# Patient Record
Sex: Female | Born: 1980 | Marital: Married | Smoking: Never smoker
Health system: Southern US, Community
[De-identification: ages and names within clinical notes are randomized; demographics above are authoritative.]

## PROBLEM LIST (undated history)

## (undated) ENCOUNTER — Inpatient Hospital Stay (HOSPITAL_COMMUNITY): Payer: Self-pay

## (undated) DIAGNOSIS — C801 Malignant (primary) neoplasm, unspecified: Secondary | ICD-10-CM

## (undated) HISTORY — DX: Malignant (primary) neoplasm, unspecified: C80.1

---

## 2009-08-27 HISTORY — PX: BREAST SURGERY: SHX581

## 2009-11-27 DIAGNOSIS — C801 Malignant (primary) neoplasm, unspecified: Secondary | ICD-10-CM

## 2009-11-27 HISTORY — DX: Malignant (primary) neoplasm, unspecified: C80.1

## 2016-09-13 ENCOUNTER — Ambulatory Visit: Payer: Self-pay

## 2016-09-13 ENCOUNTER — Encounter: Payer: Self-pay | Admitting: Family Medicine

## 2016-09-13 DIAGNOSIS — Z3A01 Less than 8 weeks gestation of pregnancy: Secondary | ICD-10-CM

## 2016-09-13 LAB — POCT PREGNANCY, URINE: Preg Test, Ur: POSITIVE — AB

## 2016-09-13 NOTE — Progress Notes (Signed)
Patient presented to office today for a pregnancy test. Test confirms she is pregnant around 6 weeks base on last menstrual cycle. Patient has been given a letter of verification to apply for pregnancy medicaid. Medications have been reviewed at this time.She was advised to start taking prenatal care.

## 2016-10-16 ENCOUNTER — Ambulatory Visit (INDEPENDENT_AMBULATORY_CARE_PROVIDER_SITE_OTHER): Payer: Self-pay | Admitting: Student

## 2016-10-16 ENCOUNTER — Encounter: Payer: Self-pay | Admitting: Student

## 2016-10-16 ENCOUNTER — Ambulatory Visit: Payer: Self-pay

## 2016-10-16 VITALS — BP 100/66 | HR 72 | Ht 64.17 in | Wt 166.6 lb

## 2016-10-16 DIAGNOSIS — Z1151 Encounter for screening for human papillomavirus (HPV): Secondary | ICD-10-CM

## 2016-10-16 DIAGNOSIS — Z124 Encounter for screening for malignant neoplasm of cervix: Secondary | ICD-10-CM

## 2016-10-16 DIAGNOSIS — Z789 Other specified health status: Secondary | ICD-10-CM

## 2016-10-16 DIAGNOSIS — O09899 Supervision of other high risk pregnancies, unspecified trimester: Secondary | ICD-10-CM | POA: Insufficient documentation

## 2016-10-16 DIAGNOSIS — O34219 Maternal care for unspecified type scar from previous cesarean delivery: Secondary | ICD-10-CM

## 2016-10-16 DIAGNOSIS — Z853 Personal history of malignant neoplasm of breast: Secondary | ICD-10-CM | POA: Insufficient documentation

## 2016-10-16 DIAGNOSIS — Z98891 History of uterine scar from previous surgery: Secondary | ICD-10-CM | POA: Insufficient documentation

## 2016-10-16 DIAGNOSIS — O09521 Supervision of elderly multigravida, first trimester: Secondary | ICD-10-CM | POA: Insufficient documentation

## 2016-10-16 DIAGNOSIS — Z113 Encounter for screening for infections with a predominantly sexual mode of transmission: Secondary | ICD-10-CM

## 2016-10-16 DIAGNOSIS — Z23 Encounter for immunization: Secondary | ICD-10-CM

## 2016-10-16 DIAGNOSIS — O3680X0 Pregnancy with inconclusive fetal viability, not applicable or unspecified: Secondary | ICD-10-CM

## 2016-10-16 DIAGNOSIS — Z3401 Encounter for supervision of normal first pregnancy, first trimester: Secondary | ICD-10-CM

## 2016-10-16 LAB — POCT URINALYSIS DIP (DEVICE)
BILIRUBIN URINE: NEGATIVE
GLUCOSE, UA: NEGATIVE mg/dL
Ketones, ur: NEGATIVE mg/dL
LEUKOCYTES UA: NEGATIVE
NITRITE: NEGATIVE
Protein, ur: NEGATIVE mg/dL
Specific Gravity, Urine: 1.015 (ref 1.005–1.030)
UROBILINOGEN UA: 0.2 mg/dL (ref 0.0–1.0)
pH: 6.5 (ref 5.0–8.0)

## 2016-10-16 NOTE — Addendum Note (Signed)
Addended by: Geralyn Flash L on: 10/16/2016 10:14 AM   Modules accepted: Orders

## 2016-10-16 NOTE — Progress Notes (Signed)
Bedside US for viability = Single IUP;  FHR - 168 bpm per M-mode;  CRL - 3.09cm (10w 0d).  [name], CNM informed of results

## 2016-10-16 NOTE — Patient Instructions (Signed)
First Trimester of Pregnancy The first trimester of pregnancy is from week 1 until the end of week 12 (months 1 through 3). During this time, your baby will begin to develop inside you. At 6-8 weeks, the eyes and face are formed, and the heartbeat can be seen on ultrasound. At the end of 12 weeks, all the baby's organs are formed. Prenatal care is all the medical care you receive before the birth of your baby. Make sure you get good prenatal care and follow all of your doctor's instructions. Follow these instructions at home: Medicines  Take medicine only as told by your doctor. Some medicines are safe and some are not during pregnancy.  Take your prenatal vitamins as told by your doctor.  Take medicine that helps you poop (stool softener) as needed if your doctor says it is okay. Diet  Eat regular, healthy meals.  Your doctor will tell you the amount of weight gain that is right for you.  Avoid raw meat and uncooked cheese.  If you feel sick to your stomach (nauseous) or throw up (vomit):  Eat 4 or 5 small meals a day instead of 3 large meals.  Try eating a few soda crackers.  Drink liquids between meals instead of during meals.  If you have a hard time pooping (constipation):  Eat high-fiber foods like fresh vegetables, fruit, and whole grains.  Drink enough fluids to keep your pee (urine) clear or pale yellow. Activity and Exercise  Exercise only as told by your doctor. Stop exercising if you have cramps or pain in your lower belly (abdomen) or low back.  Try to avoid standing for long periods of time. Move your legs often if you must stand in one place for a long time.  Avoid heavy lifting.  Wear low-heeled shoes. Sit and stand up straight.  You can have sex unless your doctor tells you not to. Relief of Pain or Discomfort  Wear a good support bra if your breasts are sore.  Take warm water baths (sitz baths) to soothe pain or discomfort caused by hemorrhoids. Use  hemorrhoid cream if your doctor says it is okay.  Rest with your legs raised if you have leg cramps or low back pain.  Wear support hose if you have puffy, bulging veins (varicose veins) in your legs. Raise (elevate) your feet for 15 minutes, 3-4 times a day. Limit salt in your diet. Prenatal Care  Schedule your prenatal visits by the twelfth week of pregnancy.  Write down your questions. Take them to your prenatal visits.  Keep all your prenatal visits as told by your doctor. Safety  Wear your seat belt at all times when driving.  Make a list of emergency phone numbers. The list should include numbers for family, friends, the hospital, and police and fire departments. General Tips  Ask your doctor for a referral to a local prenatal class. Begin classes no later than at the start of month 6 of your pregnancy.  Ask for help if you need counseling or help with nutrition. Your doctor can give you advice or tell you where to go for help.  Do not use hot tubs, steam rooms, or saunas.  Do not douche or use tampons or scented sanitary pads.  Do not cross your legs for long periods of time.  Avoid litter boxes and soil used by cats.  Avoid all smoking, herbs, and alcohol. Avoid drugs not approved by your doctor.  Do not use any tobacco products,  including cigarettes, chewing tobacco, and electronic cigarettes. If you need help quitting, ask your doctor. You may get counseling or other support to help you quit.  Visit your dentist. At home, brush your teeth with a soft toothbrush. Be gentle when you floss. Get help if:  You are dizzy.  You have mild cramps or pressure in your lower belly.  You have a nagging pain in your belly area.  You continue to feel sick to your stomach, throw up, or have watery poop (diarrhea).  You have a bad smelling fluid coming from your vagina.  You have pain with peeing (urination).  You have increased puffiness (swelling) in your face, hands,  legs, or ankles. Get help right away if:  You have a fever.  You are leaking fluid from your vagina.  You have spotting or bleeding from your vagina.  You have very bad belly cramping or pain.  You gain or lose weight rapidly.  You throw up blood. It may look like coffee grounds.  You are around people who have [place] measles, fifth disease, or chickenpox.  You have a very bad headache.  You have shortness of breath.  You have any kind of trauma, such as from a fall or a car accident. This information is not intended to replace advice given to you by your health care provider. Make sure you discuss any questions you have with your health care provider. Document Released: 05/01/2008 Document Revised: 04/20/2016 Document Reviewed: 09/23/2013 Elsevier Interactive Patient Education  2017 [place] Medications in Pregnancy   Acne: Benzoyl Peroxide Salicylic Acid  Backache/Headache: Tylenol: 2 regular strength every 4 hours OR              2 Extra strength every 6 hours  Colds/Coughs/Allergies: Benadryl (alcohol free) 25 mg every 6 hours as needed Breath right strips Claritin Cepacol throat lozenges Chloraseptic throat spray Cold-Eeze- up to three times per day Cough drops, alcohol free Flonase (by prescription only) Guaifenesin Mucinex Robitussin DM (plain only, alcohol free) Saline nasal spray/drops Sudafed (pseudoephedrine) & Actifed ** use only after [redacted] weeks gestation and if you do not have high blood pressure Tylenol Vicks Vaporub Zinc lozenges Zyrtec   Constipation: Colace Ducolax suppositories Fleet enema Glycerin suppositories Metamucil Milk of magnesia Miralax Senokot Smooth move tea  Diarrhea: Kaopectate Imodium A-D  *NO pepto Bismol  Hemorrhoids: Anusol Anusol HC Preparation H Tucks  Indigestion: Tums Maalox Mylanta Zantac  Pepcid  Insomnia: Benadryl (alcohol free) 25mg  every 6 hours as needed Tylenol  PM Unisom, no Gelcaps  Leg Cramps: Tums MagGel  Nausea/Vomiting:  Bonine Dramamine Emetrol Ginger extract Sea bands Meclizine  Nausea medication to take during pregnancy:  Unisom (doxylamine succinate 25 mg tablets) Take one tablet daily at bedtime. If symptoms are not adequately controlled, the dose can be increased to a maximum recommended dose of two tablets daily (1/2 tablet in the morning, 1/2 tablet mid-afternoon and one at bedtime). Vitamin B6 100mg  tablets. Take one tablet twice a day (up to 200 mg per day).  Skin Rashes: Aveeno products Benadryl cream or 25mg  every 6 hours as needed Calamine Lotion 1% cortisone cream  Yeast infection: Gyne-lotrimin 7 Monistat 7  Gum/tooth pain: Anbesol  **If taking multiple medications, please check labels to avoid duplicating the same active ingredients **take medication as directed on the label ** Do not exceed 4000 mg of tylenol in 24 hours **Do not take medications that contain aspirin or ibuprofen  Constipation, Adult Constipation is when a person:  Poops (has a bowel movement) fewer times in a week than normal.  Has a hard time pooping.  Has poop that is dry, hard, or bigger than normal. Follow these instructions at home: Eating and drinking  Eat foods that have a lot of fiber, such as:  Fresh fruits and vegetables.  Whole grains.  Beans.  Eat less of foods that are high in fat, low in fiber, or overly processed, such as:  [place] fries.  Hamburgers.  Cookies.  Candy.  Soda.  Drink enough fluid to keep your pee (urine) clear or pale yellow. General instructions  Exercise regularly or as told by your doctor.  Go to the restroom when you feel like you need to poop. Do not hold it in.  Take over-the-counter and prescription medicines only as told by your doctor. These include any fiber supplements.  Do pelvic floor retraining exercises, such as:  Doing deep breathing while relaxing your  lower belly (abdomen).  Relaxing your pelvic floor while pooping.  Watch your condition for any changes.  Keep all follow-up visits as told by your doctor. This is important. Contact a doctor if:  You have pain that gets worse.  You have a fever.  You have not pooped for 4 days.  You throw up (vomit).  You are not hungry.  You lose weight.  You are bleeding from the anus.  You have thin, pencil-like poop (stool). Get help right away if:  You have a fever, and your symptoms suddenly get worse.  You leak poop or have blood in your poop.  Your belly feels hard or bigger than normal (is bloated).  You have very bad belly pain.  You feel dizzy or you faint. This information is not intended to replace advice given to you by your health care provider. Make sure you discuss any questions you have with your health care provider. Document Released: 05/01/2008 Document Revised: 06/02/2016 Document Reviewed: 05/03/2016 Elsevier Interactive Patient Education  2017 [employer].

## 2016-10-16 NOTE — Progress Notes (Signed)
  Subjective:    Priscilla Lewis is being seen today for her first obstetrical visit.  This is a planned pregnancy. She is at [redacted]w[redacted]d gestation. Her obstetrical history is significant for advanced maternal age and hx of c/section. Relationship with FOB: spouse, living together. Patient does intend to breast feed. Pregnancy history fully reviewed.  Patient reports vaginal discharge & constipation.  Review of Systems:   Review of Systems Review of Systems - History obtained from the patient General ROS: negative for - chills or fever Gastrointestinal ROS: positive for - constipation negative for - abdominal pain, diarrhea, melena or nausea/vomiting Genito-Urinary ROS: positive for - vaginal discharge (thin white, no odor) negative for - vaginal bleeding or vaginal irritation  Objective:     BP 100/66   Pulse 72   Ht 5' 4.17" (1.63 m)   Wt 166 lb 9.6 oz (75.6 kg)   LMP 08/07/2016   BMI 28.45 kg/m  Physical Exam  Exam   Physical Examination: General appearance - alert, well appearing, and in no distress and oriented to person, place, and time Mouth - dental hygiene good Neck - supple, no significant adenopathy Lymphatics - no palpable lymphadenopathy Chest - clear to auscultation, no wheezes, rales or rhonchi, symmetric air entry Heart - normal rate, regular rhythm, normal S1, S2, no murmurs, rubs, clicks or gallops Abdomen - soft, nontender, nondistended, no masses or organomegaly Breasts - right breast normal without mass, skin or nipple changes or axillary nodes, post-mastectomy site well healed and free of suspicious changes (left breast) Pelvic - normal external genitalia, vulva, vagina, cervix, uterus and adnexa, PAP: Pap smear done today Skin - normal coloration and turgor, no rashes, no suspicious skin lesions noted  Assessment:    Pregnancy: [id number] Patient Active Problem List   Diagnosis Date Noted  . AMA (advanced maternal age) multigravida 64+, first trimester  10/16/2016  . Supervision of normal pregnancy in first trimester 10/16/2016  . History of cancer of left breast 10/16/2016  . Language barrier affecting health care 10/16/2016  . History of C-section 10/16/2016      Informal ultrasound performed by Diane Day for viability -- [facility] @ [redacted]w[redacted]d with cardiac activity Plan:  1. AMA (advanced maternal age) multigravida 41+, first trimester -Patient declined 1st trimester screen & genetic counseling   2. Encounter to determine fetal viability of pregnancy, single or unspecified fetus  - US OB Limited  3. Encounter for supervision of normal first pregnancy in first trimester - Cytology - PAP - Prenatal Profile - Pain Mgmt, Profile 6 Conf w/o mM, U - Hemoglobinopathy Evaluation  4. Language barrier affecting health care -Arabic interpreter at bedside    Initial labs drawn. Prenatal vitamins- currently taking. Problem list reviewed and updated. Genetic counseling: declined. Role of ultrasound in pregnancy discussed; fetal survey: will order anatomy scan at next visit. Follow up in 4 weeks.    Priscilla Lewis 10/16/2016

## 2016-10-17 LAB — PAIN MGMT, PROFILE 6 CONF W/O MM, U
6 ACETYLMORPHINE: NEGATIVE ng/mL (ref <10)
ALCOHOL METABOLITES: NEGATIVE ng/mL (ref <500)
AMPHETAMINES: NEGATIVE ng/mL (ref <500)
Barbiturates: NEGATIVE ng/mL (ref <300)
Benzodiazepines: NEGATIVE ng/mL (ref <100)
COCAINE METABOLITE: NEGATIVE ng/mL (ref <150)
Creatinine: 96.4 mg/dL (ref >=20.0)
MARIJUANA METABOLITE: NEGATIVE ng/mL (ref <20)
Methadone Metabolite: NEGATIVE ng/mL (ref <100)
OPIATES: NEGATIVE ng/mL (ref <100)
OXIDANT: NEGATIVE ug/mL (ref <200)
OXYCODONE: NEGATIVE ng/mL (ref <100)
PLEASE NOTE: 0
Phencyclidine: NEGATIVE ng/mL (ref <25)
pH: 6.86 (ref 4.5–9.0)

## 2016-10-17 LAB — PRENATAL PROFILE (SOLSTAS)
Antibody Screen: NEGATIVE
BASOS PCT: 0 %
Basophils Absolute: 0 cells/uL (ref 0–200)
EOS PCT: 2 %
Eosinophils Absolute: 102 cells/uL (ref 15–500)
HEMATOCRIT: 41 % (ref 35.0–45.0)
HEMOGLOBIN: 13.3 g/dL (ref 11.7–15.5)
HIV: NONREACTIVE
Hepatitis B Surface Ag: NEGATIVE
LYMPHS ABS: 1479 {cells}/uL (ref 850–3900)
LYMPHS PCT: 29 %
MCH: 29 pg (ref 27.0–33.0)
MCHC: 32.4 g/dL (ref 32.0–36.0)
MCV: 89.5 fL (ref 80.0–100.0)
MONO ABS: 255 {cells}/uL (ref 200–950)
MPV: 10.4 fL (ref 7.5–12.5)
Monocytes Relative: 5 %
NEUTROS PCT: 64 %
Neutro Abs: 3264 cells/uL (ref 1500–7800)
Platelets: 196 10*3/uL (ref 140–400)
RBC: 4.58 MIL/uL (ref 3.80–5.10)
RDW: 14 % (ref 11.0–15.0)
RH TYPE: POSITIVE
Rubella: 14.3 Index — ABNORMAL HIGH (ref <0.90)
WBC: 5.1 10*3/uL (ref 3.8–10.8)

## 2016-10-17 LAB — GC/CHLAMYDIA PROBE AMP (~~LOC~~) NOT AT ARMC
CHLAMYDIA, DNA PROBE: NEGATIVE
NEISSERIA GONORRHEA: NEGATIVE

## 2016-10-18 LAB — CYTOLOGY - PAP
DIAGNOSIS: NEGATIVE
HPV (WINDOPATH): NOT DETECTED

## 2016-10-20 LAB — HEMOGLOBINOPATHY EVALUATION
HCT: 41 % (ref 35.0–45.0)
HGB A2 QUANT: 2.7 % (ref 1.8–3.5)
HGB A: 96.3 % (ref >96.0)
Hemoglobin: 13.3 g/dL (ref 11.7–15.5)
MCH: 29 pg (ref 27.0–33.0)
MCV: 89.5 fL (ref 80.0–100.0)
RBC: 4.58 MIL/uL (ref 3.80–5.10)
RDW: 14 % (ref 11.0–15.0)

## 2016-11-14 ENCOUNTER — Ambulatory Visit (INDEPENDENT_AMBULATORY_CARE_PROVIDER_SITE_OTHER): Payer: Self-pay | Admitting: Family Medicine

## 2016-11-14 VITALS — BP 111/73 | HR 84 | Wt 175.5 lb

## 2016-11-14 DIAGNOSIS — O26892 Other specified pregnancy related conditions, second trimester: Secondary | ICD-10-CM

## 2016-11-14 DIAGNOSIS — O09521 Supervision of elderly multigravida, first trimester: Secondary | ICD-10-CM

## 2016-11-14 DIAGNOSIS — Z98891 History of uterine scar from previous surgery: Secondary | ICD-10-CM

## 2016-11-14 DIAGNOSIS — Z3402 Encounter for supervision of normal first pregnancy, second trimester: Secondary | ICD-10-CM

## 2016-11-14 DIAGNOSIS — N898 Other specified noninflammatory disorders of vagina: Secondary | ICD-10-CM

## 2016-11-14 DIAGNOSIS — O09522 Supervision of elderly multigravida, second trimester: Secondary | ICD-10-CM

## 2016-11-14 DIAGNOSIS — O99612 Diseases of the digestive system complicating pregnancy, second trimester: Secondary | ICD-10-CM

## 2016-11-14 DIAGNOSIS — Z3689 Encounter for other specified antenatal screening: Secondary | ICD-10-CM

## 2016-11-14 DIAGNOSIS — K5900 Constipation, unspecified: Secondary | ICD-10-CM

## 2016-11-14 LAB — HEMOGLOBIN A1C
Hgb A1c MFr Bld: 4.6 % (ref <5.7)
Mean Plasma Glucose: 85 mg/dL

## 2016-11-14 MED ORDER — DOCUSATE SODIUM 100 MG PO CAPS: 100.0000 mg | ORAL_CAPSULE | Freq: Two times a day (BID) | ORAL | 1 refills | Status: DC

## 2016-11-14 NOTE — Patient Instructions (Addendum)
High-Fiber Diet Fiber, also called dietary fiber, is a type of carbohydrate found in fruits, vegetables, whole grains, and beans. A high-fiber diet can have many health benefits. Your health care provider may recommend a high-fiber diet to help:  Prevent constipation. Fiber can make your bowel movements more regular.  Lower your cholesterol.  Relieve hemorrhoids, uncomplicated diverticulosis, or irritable bowel syndrome.  Prevent overeating as part of a weight-loss plan.  Prevent heart disease, type 2 diabetes, and certain cancers. What is my plan? The recommended daily intake of fiber includes:  38 grams for men under age 66.  42 grams for men over age 84.  36 grams for women under age 46.  15 grams for women over age 15. You can get the recommended daily intake of dietary fiber by eating a variety of fruits, vegetables, grains, and beans. Your health care provider may also recommend a fiber supplement if it is not possible to get enough fiber through your diet. What do I need to know about a high-fiber diet?  Fiber supplements have not been widely studied for their effectiveness, so it is better to get fiber through food sources.  Always check the fiber content on thenutrition facts label of any prepackaged food. Look for foods that contain at least 5 grams of fiber per serving.  Ask your dietitian if you have questions about specific foods that are related to your condition, especially if those foods are not listed in the following section.  Increase your daily fiber consumption gradually. Increasing your intake of dietary fiber too quickly may cause bloating, cramping, or gas.  Drink plenty of water. Water helps you to digest fiber. What foods can I eat? Grains  Whole-grain breads. Multigrain cereal. Oats and oatmeal. Brown rice. Barley. Bulgur wheat. [place]. Bran muffins. Popcorn. Rye wafer crackers. Vegetables  Sweet potatoes. Spinach. Kale. Artichokes. Cabbage. Broccoli.  Green peas. Carrots. Squash. Fruits  Berries. Pears. Apples. Oranges. Avocados. Prunes and raisins. Dried figs. Meats and Other Protein Sources  [place], kidney, pinto, and soy beans. Split peas. Lentils. Nuts and seeds. Dairy  Fiber-fortified yogurt. Beverages  Fiber-fortified soy milk. Fiber-fortified orange juice. Other  Fiber bars. The items listed above may not be a complete list of recommended foods or beverages. Contact your dietitian for more options.  What foods are not recommended? Grains  White bread. Pasta made with refined flour. White rice. Vegetables  Fried potatoes. Canned vegetables. Well-cooked vegetables. Fruits  Fruit juice. Cooked, strained fruit. Meats and Other Protein Sources  Fatty cuts of meat. Fried Sales executive or fried fish. Dairy  Milk. Yogurt. Cream cheese. Sour cream. Beverages  Soft drinks. Other  Cakes and pastries. Butter and oils. The items listed above may not be a complete list of foods and beverages to avoid. Contact your dietitian for more information.  What are some tips for including high-fiber foods in my diet?  Eat a wide variety of high-fiber foods.  Make sure that half of all grains consumed each day are whole grains.  Replace breads and cereals made from refined flour or white flour with whole-grain breads and cereals.  Replace white rice with brown rice, bulgur wheat, or millet.  Start the day with a breakfast that is high in fiber, such as a cereal that contains at least 5 grams of fiber per serving.  Use beans in place of meat in soups, salads, or pasta.  Eat high-fiber snacks, such as berries, raw vegetables, nuts, or popcorn. This information is not intended to replace  advice given to you by your health care provider. Make sure you discuss any questions you have with your health care provider. Document Released: 11/13/2005 Document Revised: 04/20/2016 Document Reviewed: 04/28/2014 Elsevier Interactive Patient Education  2017  [place] After Cesarean Delivery Information A trial of labor after cesarean delivery (TOLAC) is when a woman tries to give birth vaginally after a previous cesarean delivery. TOLAC may be a safe and appropriate option for you depending on your medical history and other risk factors. When TOLAC is successful and you are able to have a vaginal delivery, this is called a vaginal birth after cesarean delivery (VBAC).  CANDIDATES FOR TOLAC TOLAC is possible for some women who: Have undergone one or two prior cesarean deliveries in which the incision of the uterus was horizontal (low transverse). Are carrying twins and have had one prior low transverse incision during a cesarean delivery. Do not have a vertical (classical) uterine scar. Have not had a tear in the wall of their uterus (uterine rupture). TOLAC is also supported for women who meet appropriate criteria and: Are under the age of 13 years. Are tall and have a body mass index (BMI) of less than 30. Have an unknown uterine scar. Give birth in a facility equipped to handle an emergency cesarean delivery. This team should be able to handle possible complications such as a uterine rupture. Have thorough counseling about the benefits and risks of TOLAC. Have discussed future pregnancy plans with their health care provider. Plan to have several more pregnancies. MOST SUCCESSFUL CANDIDATES FOR TOLAC: Have had a successful vaginal delivery before or after their cesarean delivery. Experience labor that begins naturally on or before the due date (40 weeks of gestation). Do not have a very large (macrosomic) baby.  Had a prior cesarean delivery but are not currently experiencing factors that would prompt a cesarean delivery (such as a breech position). Had only one prior cesarean delivery. Had a prior cesarean delivery that was performed early in labor and not after full cervical dilation. TOLAC may be most appropriate for  women who meet the above guidelines and who plan to have more pregnancies. TOLAC is not recommended for home births. LEAST SUCCESSFUL CANDIDATES FOR TOLAC: Have an induced labor with an unfavorable cervix. An unfavorable cervix is when the cervix is not dilating enough (among other factors). Have never had a vaginal delivery. Have had more than two cesarean deliveries. Have a pregnancy at more than 40 weeks of gestation. Are pregnant with a baby with a suspected weight greater than 4,000 grams (8 pounds) and who have no prior history of a vaginal delivery. Have closely spaced pregnancies. SUGGESTED BENEFITS OF TOLAC You may have a faster recovery time. You may have a shorter stay in the hospital. You may have less pain and fewer problems than with a cesarean delivery. Women who have a cesarean delivery have a higher chance of needing blood or getting a fever, an infection, or a blood clot in the legs. SUGGESTED RISKS OF TOLAC The highest risk of complications happens to women who attempt a TOLAC and fail. A failed TOLAC results in an unplanned cesarean delivery. Risks related to [facility] or repeat cesarean deliveries include:  Blood loss. Infection. Blood clot. Injury to surrounding tissues or organs. Having to remove the uterus (hysterectomy). Potential problems with the placenta (such as placenta previa or placenta accreta) in future pregnancies. Although very rare, the main concerns with TOLAC are: Rupture of the uterine scar  from a past cesarean delivery. Needing an emergency cesarean delivery. Having a bad outcome for the baby (perinatal morbidity). FOR MORE INFORMATION American [employer] of Obstetricians [employer] Gynecologists: www.acog.[place]: www.midwife.org This information is not intended to replace advice given to you by your health care provider. Make sure you discuss any questions you have with your health care provider. Document Released: 08/01/2011  Document Revised: 09/03/2013 Document Reviewed: 05/05/2013 Elsevier Interactive Patient Education  2017 [employer].

## 2016-11-14 NOTE — Progress Notes (Signed)
Video Interpreter # [id number] Pt c/o white discharge with itchiness  Anatomy US scheduled for January 23rd @ 0845.  Pt notified.  VBAC Consent signed

## 2016-11-14 NOTE — Progress Notes (Signed)
Subjective:  Priscilla Lewis is a 35 y.o. [id number] at [redacted]w[redacted]d being seen today for ongoing prenatal care.  She is currently monitored for the following issues for this high-risk pregnancy and has AMA (advanced maternal age) multigravida 20+, first trimester; Supervision of normal pregnancy in first trimester; History of cancer of left breast; Language barrier affecting health care; and History of C-section on her problem list.  Patient reports vaginal irritation and white vaginal discharge, itching. She also reports weight gain. Per records she has gained 9 lbs since last visit already. She is not exercising and is eating more..  Contractions: Not present. Vag. Bleeding: None.   . Denies leaking of fluid.   The following portions of the patient's history were reviewed and updated as appropriate: allergies, current medications, past family history, past medical history, past social history, past surgical history and problem list. Problem list updated.  Objective:   Vitals:   11/14/16 0833  BP: 111/73  Pulse: 84  Weight: 175 lb 8 oz (79.6 kg)    Fetal Status: Fetal Heart Rate (bpm): 153         General:  Alert, oriented and cooperative. Patient is in no acute distress.  Skin: Skin is warm and dry. No rash noted.   Cardiovascular: Normal heart rate noted  Respiratory: Normal respiratory effort, no problems with respiration noted  Abdomen: Soft, gravid, appropriate for gestational age. Pain/Pressure: Absent     Pelvic:  Cervical exam deferred        Extremities: Normal range of motion.  Edema: None  Mental Status: Normal mood and affect. Normal behavior. Normal judgment and thought content.   Urinalysis:      Assessment and Plan:  Pregnancy: [id number] at [redacted]w[redacted]d  1. Supervision of low-risk first pregnancy, second trimester - 9 lb weight gain in 4 weeks, screening for pre-gestational diabetes today. Discussed diet and exercise. May benefit from dietary consult next visit. Patient is  fasting today (aside from a lemon in water this AM) - [place] MFM OB COMP + 14 WK; Future - Glucose tolerance, 2 hours - HgB A1c  2. Vaginal discharge during pregnancy in second trimester - No overt appearing yeast on pelvic exam - Wet prep, genital  3. Encounter for fetal anatomic survey - [place] MFM OB COMP + 14 WK; Future  4. AMA (advanced maternal age) multigravida 84+, first trimester - Patient had declined first trim and genetic counseling at previous visit.  5. History of C-section - 10 years ago was cesarean, only had 1 cesarean and the other two were vaginal (first two deliveries). - Cesarean was due to NRFHT (nuchal cords) - Discussed risks associated with TOLAC, she would like to proceed. (Consented today) - Desires tubal ligation whether cesarean performed or postpartum BTL.  6. Constipation during pregnancy in second trimester - Colace Rx - Discussed diet and hydration   Preterm labor symptoms and general obstetric precautions including but not limited to vaginal bleeding, contractions, leaking of fluid and fetal movement were reviewed in detail with the patient. Please refer to After Visit Summary for other counseling recommendations.  Return in about 4 weeks (around 12/12/2016) for Routine OB visit, may need dietician consult.   Trial of Labor after Cesarean Consent  35 y.o. [id number] at [redacted]w[redacted]d with Estimated Date of Delivery: 05/14/17 was seen today in office to discuss trial of labor after cesarean section (TOLAC) versus elective repeat cesarean delivery (ERCD). The following risks were discussed with the patient.  Risk of uterine rupture at  term is 0.78 percent with TOLAC and 0.22 percent with ERCD. 1 in 10 uterine ruptures will result in neonatal death or neurological injury. The benefits of a trial of labor after cesarean (TOLAC) resulting in a vaginal birth after cesarean (VBAC) include the following: shorter length of hospital stay and postpartum recovery (in most cases);  fewer complications, such as postpartum fever, wound or uterine infection, thromboembolism (blood clots in the leg or lung), need for blood transfusion and fewer neonatal breathing problems. The risks of an attempted VBAC or TOLAC include the following: Risk of failed trial of labor after cesarean (TOLAC) without a vaginal birth after cesarean (VBAC) resulting in repeat cesarean delivery (RCD) in about 20 to 30 percent of women who attempt VBAC.  Risk of rupture of uterus resulting in an emergency cesarean delivery. The risk of uterine rupture may be related in part to the type of uterine incision made during the first cesarean delivery. A previous transverse uterine incision has the lowest risk of rupture (0.2 to 1.5 percent risk). Vertical or T-shaped uterine incisions have a higher risk of uterine rupture (4 to 9 percent risk)The risk of fetal death is very low with both VBAC and elective repeat cesarean delivery (ERCD), but the likelihood of fetal death is higher with VBAC than with ERCD. Maternal death is very rare with either type of delivery. The risks of an elective repeat cesarean delivery (ERCD) were reviewed with the patient including but not limited to: 12/998 risk of uterine rupture which could have serious consequences, bleeding which may require transfusion; infection which may require antibiotics; injury to bowel, bladder or other surrounding organs (bowel, bladder, ureters); injury to the fetus; need for additional procedures including hysterectomy in the event of a life-threatening hemorrhage; thromboembolic phenomenon; abnormal placentation; incisional problems; death and other postoperative or anesthesia complications.    These risks and benefits are summarized on the consent form, which was reviewed with the patient during the visit.  All her questions answered and she signed a consent indicating a preference for TOLAC/ERCD. A copy of the consent was given to the patient.  (She does say  that she would want tubal sterilization in the event a cesarean section is performed due to any maternal-fetal indication.)    Isaias Sakai, DO OB Fellow Center for [facility], [facility]

## 2016-11-15 LAB — WET PREP, GENITAL
TRICH WET PREP: NONE SEEN
Yeast Wet Prep HPF POC: NONE SEEN

## 2016-11-15 LAB — 2HR GTT W 1 HR, CARPENTER, 75 G
GLUCOSE, FASTING, GEST: 71 mg/dL (ref 65–91)
Glucose, 1 Hr, Gest: 100 mg/dL (ref <180)
Glucose, 2 Hr, Gest: 68 mg/dL (ref <153)

## 2016-11-27 NOTE — L&D Delivery Note (Addendum)
Indication for operative vaginal delivery: Repetitive prolonged decels  Risks of vacuum assistance were discussed in detail, including but not limited to, bleeding, infection, damage to maternal tissues, fetal cephalohematoma, inability to effect vaginal delivery of the head or shoulder dystocia that cannot be resolved by established maneuvers and need for emergency cesarean section.  Patient gave verbal consent.  Patient was examined and found to be fully dilated with fetal station of +2. The soft Kiwi vacuum soft cup was positioned over the sagittal suture 3 cm anterior to posterior fontanelle.  Pressure was then increased to 500 mmHg, and the patient was instructed to push.  Pulling was administered along the pelvic curve.  A single pull was administered during 1 push, NO popoffs.  The infant was then delivered atraumatically    36 y.o. [id number] at [redacted]w[redacted]d delivered via vacuum assistance a viable female infant in cephalic, OA position. Loose nuchal cord x1, easily reduced. Right posterior arm/hand delivered first due to compound hand, anterior shoulder delivered with ease. 60 sec delayed cord clamping. Cord clamped x2 and cut. Cord blood gas collected and sent. Placenta delivered spontaneously intact, with 3VC. Fundus firm on exam with massage and pitocin. Good hemostasis noted.  Anesthesia: Epidural Laceration: None Suture: N/A Good hemostasis noted. EBL: 150cc  Mom and baby recovering in LDR.    Apgars: APGAR (1 MIN): 8   APGAR (5 MINS): 9   Weight: Pending skin to skin  Cord gas pH: 7.250  Sponge and instrument count were correct x2. Placenta sent to L&D.  Dr. Kennon Rounds was present for entire vacuum delivery.  Katherine Basset, DO OB Fellow Center for [facility], [facility] 05/15/2017, 2:03 AM

## 2016-11-30 ENCOUNTER — Other Ambulatory Visit: Payer: Self-pay

## 2016-11-30 MED ORDER — METRONIDAZOLE 500 MG PO TABS: 500.0000 mg | ORAL_TABLET | Freq: Two times a day (BID) | ORAL | 0 refills | Status: DC

## 2016-11-30 NOTE — Telephone Encounter (Signed)
Patient has a UTI and Flagyl has been called into her pharmacy.

## 2016-12-04 ENCOUNTER — Telehealth: Payer: Self-pay | Admitting: *Deleted

## 2016-12-04 NOTE — Telephone Encounter (Signed)
Per results review + BV 11/14/16- message from Dr. Vanetta Shawl to notify patient. Per chart review Faythe Dingwall, CMA called patient 11/30/16 and left message for [place] to call our office.

## 2016-12-08 ENCOUNTER — Encounter (HOSPITAL_COMMUNITY): Payer: Self-pay | Admitting: Family Medicine

## 2016-12-12 NOTE — Telephone Encounter (Signed)
[place] confirmed that pt has not picked up Rx for Flagyl.  She has office appt on 1/17 - will be notified at that time.

## 2016-12-13 ENCOUNTER — Encounter: Payer: Self-pay | Admitting: Certified Nurse Midwife

## 2016-12-16 ENCOUNTER — Encounter (HOSPITAL_COMMUNITY): Payer: Self-pay

## 2016-12-19 ENCOUNTER — Other Ambulatory Visit: Payer: Self-pay | Admitting: Family Medicine

## 2016-12-19 ENCOUNTER — Ambulatory Visit (HOSPITAL_COMMUNITY)
Admission: RE | Admit: 2016-12-19 | Discharge: 2016-12-19 | Disposition: A | Discharge status: Home / Self Care | Payer: Medicaid Other | Source: Ambulatory Visit | Attending: Family Medicine | Admitting: Family Medicine

## 2016-12-19 ENCOUNTER — Ambulatory Visit (INDEPENDENT_AMBULATORY_CARE_PROVIDER_SITE_OTHER): Payer: Self-pay | Admitting: Medical

## 2016-12-19 DIAGNOSIS — O09522 Supervision of elderly multigravida, second trimester: Secondary | ICD-10-CM

## 2016-12-19 DIAGNOSIS — O34219 Maternal care for unspecified type scar from previous cesarean delivery: Secondary | ICD-10-CM

## 2016-12-19 DIAGNOSIS — Z3402 Encounter for supervision of normal first pregnancy, second trimester: Secondary | ICD-10-CM

## 2016-12-19 DIAGNOSIS — Z3689 Encounter for other specified antenatal screening: Secondary | ICD-10-CM | POA: Insufficient documentation

## 2016-12-19 DIAGNOSIS — O34211 Maternal care for low transverse scar from previous cesarean delivery: Secondary | ICD-10-CM | POA: Insufficient documentation

## 2016-12-19 DIAGNOSIS — Z3A19 19 weeks gestation of pregnancy: Secondary | ICD-10-CM

## 2016-12-19 DIAGNOSIS — Z3481 Encounter for supervision of other normal pregnancy, first trimester: Secondary | ICD-10-CM

## 2016-12-19 NOTE — Progress Notes (Signed)
   PRENATAL VISIT NOTE  Subjective:  Priscilla Lewis is a 36 y.o. [id number] at [redacted]w[redacted]d being seen today for ongoing prenatal care.  She is currently monitored for the following issues for this high-risk pregnancy and has AMA (advanced maternal age) multigravida 48+, first trimester; Supervision of normal pregnancy in first trimester; History of cancer of left breast; Language barrier affecting health care; and History of C-section on her problem list.  Patient reports headache.  Contractions: Not present. Vag. Bleeding: None.  Movement: Present. Denies leaking of fluid.   The following portions of the patient's history were reviewed and updated as appropriate: allergies, current medications, past family history, past medical history, past social history, past surgical history and problem list. Problem list updated.  Objective:   Vitals:   12/19/16 1002  BP: 110/62  Pulse: 84  Weight: 181 lb (82.1 kg)    Fetal Status: Fetal Heart Rate (bpm): 150   Movement: Present     General:  Alert, oriented and cooperative. Patient is in no acute distress.  Skin: Skin is warm and dry. No rash noted.   Cardiovascular: Normal heart rate noted  Respiratory: Normal respiratory effort, no problems with respiration noted  Abdomen: Soft, gravid, appropriate for gestational age. Pain/Pressure: Absent     Pelvic:  Cervical exam deferred        Extremities: Normal range of motion.  Edema: Trace  Mental Status: Normal mood and affect. Normal behavior. Normal judgment and thought content.   Assessment and Plan:  Pregnancy: [id number] at [redacted]w[redacted]d  1. Encounter for supervision of other normal pregnancy in first trimester - Reviewed anatomy US results - normal- female  - Discussed trying Tylenol PRN for headaches and increased PO hydration, if still frequent headaches at next visit will discuss other medications - Patient unsure about breast feeding due to history of breast cancer and unilateral mastectomy, advised to  consult with lactation, but likely will need to at least supplement with formula  Second trimester warning signs/symptoms and general obstetric precautions including but not limited to vaginal bleeding, contractions, leaking of fluid and fetal movement were reviewed in detail with the patient. Please refer to After Visit Summary for other counseling recommendations.  Return in about 4 weeks (around 01/16/2017) for LOB.   Luvenia Redden, PA-C

## 2016-12-19 NOTE — Patient Instructions (Addendum)
Second Trimester of Pregnancy The second trimester is from week 13 through week 28, month 4 through 6. This is often the time in pregnancy that you feel your best. Often times, morning sickness has lessened or quit. You may have more energy, and you may get hungry more often. Your unborn baby (fetus) is growing rapidly. At the end of the sixth month, he or she is about 9 inches long and weighs about 1 pounds. You will likely feel the baby move (quickening) between 18 and 20 weeks of pregnancy. Follow these instructions at home:  Avoid all smoking, herbs, and alcohol. Avoid drugs not approved by your doctor.  Do not use any tobacco products, including cigarettes, chewing tobacco, and electronic cigarettes. If you need help quitting, ask your doctor. You may get counseling or other support to help you quit.  Only take medicine as told by your doctor. Some medicines are safe and some are not during pregnancy.  Exercise only as told by your doctor. Stop exercising if you start having cramps.  Eat regular, healthy meals.  Wear a good support bra if your breasts are tender.  Do not use hot tubs, steam rooms, or saunas.  Wear your seat belt when driving.  Avoid raw meat, uncooked cheese, and liter boxes and soil used by cats.  Take your prenatal vitamins.  Take 1500-2000 milligrams of calcium daily starting at the 20th week of pregnancy until you deliver your baby.  Try taking medicine that helps you poop (stool softener) as needed, and if your doctor approves. Eat more fiber by eating fresh fruit, vegetables, and whole grains. Drink enough fluids to keep your pee (urine) clear or pale yellow.  Take warm water baths (sitz baths) to soothe pain or discomfort caused by hemorrhoids. Use hemorrhoid cream if your doctor approves.  If you have puffy, bulging veins (varicose veins), wear support hose. Raise (elevate) your feet for 15 minutes, 3-4 times a day. Limit salt in your diet.  Avoid heavy  lifting, wear low heals, and sit up straight.  Rest with your legs raised if you have leg cramps or low back pain.  Visit your dentist if you have not gone during your pregnancy. Use a soft toothbrush to brush your teeth. Be gentle when you floss.  You can have sex (intercourse) unless your doctor tells you not to.  Go to your doctor visits. Get help if:  You feel dizzy.  You have mild cramps or pressure in your lower belly (abdomen).  You have a nagging pain in your belly area.  You continue to feel sick to your stomach (nauseous), throw up (vomit), or have watery poop (diarrhea).  You have bad smelling fluid coming from your vagina.  You have pain with peeing (urination). Get help right away if:  You have a fever.  You are leaking fluid from your vagina.  You have spotting or bleeding from your vagina.  You have severe belly cramping or pain.  You lose or gain weight rapidly.  You have trouble catching your breath and have chest pain.  You notice sudden or extreme puffiness (swelling) of your face, hands, ankles, feet, or legs.  You have not felt the baby move in over an hour.  You have severe headaches that do not go away with medicine.  You have vision changes. This information is not intended to replace advice given to you by your health care provider. Make sure you discuss any questions you have with your health care   provider. Document Released: 02/07/2010 Document Revised: 04/20/2016 Document Reviewed: 01/14/2013 Elsevier Interactive Patient Education  2017 [place] Syndrome Introduction Carpal tunnel syndrome is a condition that causes pain in your hand and arm. The carpal tunnel is a narrow area that is on the palm side of your wrist. Repeated wrist motion or certain diseases may cause swelling in the tunnel. This swelling can pinch the main nerve in the wrist (median nerve). Follow these instructions at home: If you have a  splint:  Wear it as told by your doctor. Remove it only as told by your doctor.  Loosen the splint if your fingers:  Become numb and tingle.  Turn blue and cold.  Keep the splint clean and dry. General instructions  Take over-the-counter and prescription medicines only as told by your doctor.  Rest your wrist from any activity that may be causing your pain. If needed, talk to your employer about changes that can be made in your work, such as getting a wrist pad to use while typing.  If directed, apply ice to the painful area:  Put ice in a plastic bag.  Place a towel between your skin and the bag.  Leave the ice on for 20 minutes, 2-3 times per day.  Keep all follow-up visits as told by your doctor. This is important.  Do any exercises as told by your doctor, physical therapist, or occupational therapist. Contact a doctor if:  You have new symptoms.  Medicine does not help your pain.  Your symptoms get worse. This information is not intended to replace advice given to you by your health care provider. Make sure you discuss any questions you have with your health care provider. Document Released: 11/02/2011 Document Revised: 04/20/2016 Document Reviewed: 03/31/2015  2017 Elsevier

## 2016-12-19 NOTE — Progress Notes (Signed)
Arabic video interpreter "Mina" [phone] used for visit Pt c/o frequent headaches

## 2017-01-16 ENCOUNTER — Ambulatory Visit (INDEPENDENT_AMBULATORY_CARE_PROVIDER_SITE_OTHER): Payer: Self-pay | Admitting: Student

## 2017-01-16 VITALS — BP 99/45 | HR 90 | Wt 194.1 lb

## 2017-01-16 DIAGNOSIS — Z789 Other specified health status: Secondary | ICD-10-CM

## 2017-01-16 DIAGNOSIS — O09521 Supervision of elderly multigravida, first trimester: Secondary | ICD-10-CM

## 2017-01-16 DIAGNOSIS — Z3481 Encounter for supervision of other normal pregnancy, first trimester: Secondary | ICD-10-CM

## 2017-01-16 NOTE — Patient Instructions (Signed)
Gestational Diabetes Mellitus, Diagnosis Gestational diabetes (gestational diabetes mellitus) is a short-term (temporary) form of diabetes that can happen during pregnancy. It goes away after you give birth. It may be caused by one or both of these problems:  Your body does not make enough of a hormone called insulin.  Your body does not respond in a normal way to insulin that it makes. Insulin lets sugars (glucose) go into cells in the body. This gives you energy. If you have diabetes, sugars cannot get into cells. This causes high blood sugar (hyperglycemia). If diabetes is treated, it may not hurt you or your baby. Your doctor will set treatment goals for you. In general, you should have these blood sugar levels:  After not eating for a long time (fasting): 95 mg/dL (5.3 mmol/L).  After meals (postprandial):  One hour after a meal: at or below 140 mg/dL (7.8 mmol/L).  Two hours after a meal: at or below 120 mg/dL (6.7 mmol/L).  A1c (hemoglobin A1c) level: 6-6.5%. Follow these instructions at home: Questions to Ask Your Doctor  You may want to ask these questions:  Do I need to meet with a diabetes educator?  Where can I find a support group for people with diabetes?  What equipment will I need to care for myself at home?  What diabetes medicines do I need? When should I take them?  How often do I need to check my blood sugar?  What number can I call if I have questions?  When is my next doctor's visit? General instructions  Take over-the-counter and prescription medicines only as told by your doctor.  Stay at a healthy weight during pregnancy.  Keep all follow-up visits as told by your doctor. This is important. Contact a doctor if:  Your blood sugar is at or above 240 mg/dL (13.3 mmol/L).  Your blood sugar is at or above 200 mg/dL (11.1 mmol/L) and you have ketones in your pee (urine).  You have been sick or have had a fever for 2 days or more and you are not  getting better.  You have any of these problems for more than 6 hours:  You cannot eat or drink.  You feel sick to your stomach (nauseous).  You throw up (vomit).  You have watery poop (diarrhea). Get help right away if:  Your blood sugar is lower than 54 mg/dL (3 mmol/L).  You get confused.  You have trouble:  Thinking clearly.  Breathing.  Your baby moves less than normal.  You have:  Moderate or large ketone levels in your pee (urine).  Bleeding from your vagina.  Unusual fluid coming from your vagina.  Early contractions. These may feel like tightness in your belly. This information is not intended to replace advice given to you by your health care provider. Make sure you discuss any questions you have with your health care provider. Document Released: 03/06/2016 Document Revised: 04/20/2016 Document Reviewed: 12/17/2015 Elsevier Interactive Patient Education  2017 [employer].

## 2017-01-16 NOTE — Progress Notes (Signed)
   PRENATAL VISIT NOTE  Subjective:  Priscilla Lewis is a 36 y.o. [id number] at [redacted]w[redacted]d being seen today for ongoing prenatal care.  She is currently monitored for the following issues for this low-risk pregnancy and has AMA (advanced maternal age) multigravida 33+, first trimester; Supervision of normal pregnancy in first trimester; History of cancer of left breast; Language barrier affecting health care; and History of C-section on her problem list.  Patient reports no complaints.  Contractions: Not present. Vag. Bleeding: None.  Movement: Present. Denies leaking of fluid.   The following portions of the patient's history were reviewed and updated as appropriate: allergies, current medications, past family history, past medical history, past social history, past surgical history and problem list. Problem list updated.  Objective:   Vitals:   01/16/17 0953  BP: (!) 99/45  Pulse: 90  Weight: 194 lb 1.6 oz (88 kg)    Fetal Status: Fetal Heart Rate (bpm): 139   Movement: Present     General:  Alert, oriented and cooperative. Patient is in no acute distress.  Skin: Skin is warm and dry. No rash noted.   Cardiovascular: Normal heart rate noted  Respiratory: Normal respiratory effort, no problems with respiration noted  Abdomen: Soft, gravid, appropriate for gestational age. Pain/Pressure: Absent     Pelvic:  Cervical exam deferred        Extremities: Normal range of motion.  Edema: Mild pitting, slight indentation  Mental Status: Normal mood and affect. Normal behavior. Normal judgment and thought content.   Assessment and Plan:  Pregnancy: [id number] at [redacted]w[redacted]d  1. Encounter for supervision of other normal pregnancy in first trimester Return in 4 weeks for 2 hour Gtt  2. AMA (advanced maternal age) multigravida 32+, first trimester   3. Language barrier affecting health care Interpreter Asmaa [phone]  Preterm labor symptoms and general obstetric precautions including but not limited to  vaginal bleeding, contractions, leaking of fluid and fetal movement were reviewed in detail with the patient. Please refer to After Visit Summary for other counseling recommendations.  Return in about 4 weeks (around 02/13/2017) for 2 hour and ROB.   Starr Lake, CNM

## 2017-02-13 ENCOUNTER — Encounter (HOSPITAL_COMMUNITY): Payer: Self-pay | Admitting: *Deleted

## 2017-02-13 ENCOUNTER — Ambulatory Visit (INDEPENDENT_AMBULATORY_CARE_PROVIDER_SITE_OTHER): Payer: Self-pay | Admitting: Advanced Practice Midwife

## 2017-02-13 ENCOUNTER — Inpatient Hospital Stay (HOSPITAL_COMMUNITY)
Admission: AD | Admit: 2017-02-13 | Discharge: 2017-02-13 | Disposition: A | Discharge status: Home / Self Care | Payer: Medicaid Other | Source: Ambulatory Visit | Attending: Family Medicine | Admitting: Family Medicine

## 2017-02-13 VITALS — BP 112/68 | HR 102 | Wt 198.7 lb

## 2017-02-13 DIAGNOSIS — O479 False labor, unspecified: Secondary | ICD-10-CM | POA: Insufficient documentation

## 2017-02-13 DIAGNOSIS — O99612 Diseases of the digestive system complicating pregnancy, second trimester: Secondary | ICD-10-CM

## 2017-02-13 DIAGNOSIS — O34219 Maternal care for unspecified type scar from previous cesarean delivery: Secondary | ICD-10-CM

## 2017-02-13 DIAGNOSIS — O09521 Supervision of elderly multigravida, first trimester: Secondary | ICD-10-CM

## 2017-02-13 DIAGNOSIS — Z3481 Encounter for supervision of other normal pregnancy, first trimester: Secondary | ICD-10-CM

## 2017-02-13 DIAGNOSIS — K219 Gastro-esophageal reflux disease without esophagitis: Secondary | ICD-10-CM

## 2017-02-13 DIAGNOSIS — O09522 Supervision of elderly multigravida, second trimester: Secondary | ICD-10-CM

## 2017-02-13 DIAGNOSIS — Z98891 History of uterine scar from previous surgery: Secondary | ICD-10-CM

## 2017-02-13 DIAGNOSIS — Z3A00 Weeks of gestation of pregnancy not specified: Secondary | ICD-10-CM | POA: Diagnosis not present

## 2017-02-13 DIAGNOSIS — Z23 Encounter for immunization: Secondary | ICD-10-CM

## 2017-02-13 LAB — URINALYSIS, ROUTINE W REFLEX MICROSCOPIC
Bilirubin Urine: NEGATIVE
Glucose, UA: NEGATIVE mg/dL
Ketones, ur: NEGATIVE mg/dL
Leukocytes, UA: NEGATIVE
Nitrite: NEGATIVE
Protein, ur: NEGATIVE mg/dL
Specific Gravity, Urine: 1.024 (ref 1.005–1.030)
pH: 6 (ref 5.0–8.0)

## 2017-02-13 LAB — FETAL FIBRONECTIN: Fetal Fibronectin: NEGATIVE

## 2017-02-13 MED ORDER — NIFEDIPINE 10 MG PO CAPS
10.0000 mg | ORAL_CAPSULE | Freq: Once | ORAL | Status: DC
Start: 2017-02-13 — End: 2017-02-13
  Filled 2017-02-13: qty 1

## 2017-02-13 MED ORDER — RANITIDINE HCL 150 MG PO TABS: 150.0000 mg | ORAL_TABLET | Freq: Two times a day (BID) | ORAL | 3 refills | Status: DC

## 2017-02-13 NOTE — Discharge Instructions (Signed)
Braxton Hicks Contractions Contractions of the uterus can occur throughout pregnancy, but they are not always a sign that you are in labor. You may have practice contractions called Braxton Hicks contractions. These false labor contractions are sometimes confused with true labor. What are [place] contractions? Braxton Hicks contractions are tightening movements that occur in the muscles of the uterus before labor. Unlike true labor contractions, these contractions do not result in opening (dilation) and thinning of the cervix. Toward the end of pregnancy (32-34 weeks), Braxton Hicks contractions can happen more often and may become stronger. These contractions are sometimes difficult to tell apart from true labor because they can be very uncomfortable. You should not feel embarrassed if you go to the hospital with false labor. Sometimes, the only way to tell if you are in true labor is for your health care provider to look for changes in the cervix. The health care provider will do a physical exam and may monitor your contractions. If you are not in true labor, the exam should show that your cervix is not dilating and your water has not broken. If there are no prenatal problems or other health problems associated with your pregnancy, it is completely safe for you to be sent home with false labor. You may continue to have Braxton Hicks contractions until you go into true labor. How can I tell the difference between true labor and false labor?  Differences  False labor  Contractions last 30-70 seconds.: Contractions are usually shorter and not as strong as true labor contractions.  Contractions become very regular.: Contractions are usually irregular.  Discomfort is usually felt in the top of the uterus, and it spreads to the lower abdomen and low back.: Contractions are often felt in the front of the lower abdomen and in the groin.  Contractions do not go away with walking.: Contractions may  go away when you walk around or change positions while lying down.  Contractions usually become more intense and increase in frequency.: Contractions get weaker and are shorter-lasting as time goes on.  The cervix dilates and gets thinner.: The cervix usually does not dilate or become thin. Follow these instructions at home:  Take over-the-counter and prescription medicines only as told by your health care provider.  Keep up with your usual exercises and follow other instructions from your health care provider.  Eat and drink lightly if you think you are going into labor.  If Braxton Hicks contractions are making you uncomfortable:  Change your position from lying down or resting to walking, or change from walking to resting.  Sit and rest in a tub of warm water.  Drink enough fluid to keep your urine clear or pale yellow. Dehydration may cause these contractions.  Do slow and deep breathing several times an hour.  Keep all follow-up prenatal visits as told by your health care provider. This is important. Contact a health care provider if:  You have a fever.  You have continuous pain in your abdomen. Get help right away if:  Your contractions become stronger, more regular, and closer together.  You have fluid leaking or gushing from your vagina.  You pass blood-tinged mucus (bloody show).  You have bleeding from your vagina.  You have low back pain that you never had before.  You feel your babys head pushing down and causing pelvic pressure.  Your baby is not moving inside you as much as it used to. Summary  Contractions that occur before labor are  called Braxton Hicks contractions, false labor, or practice contractions.  Braxton Hicks contractions are usually shorter, weaker, farther apart, and less regular than true labor contractions. True labor contractions usually become progressively stronger and regular and they become more frequent.  Manage discomfort from  [facility] contractions by changing position, resting in a warm bath, drinking plenty of water, or practicing deep breathing. This information is not intended to replace advice given to you by your health care provider. Make sure you discuss any questions you have with your health care provider. Document Released: 11/13/2005 Document Revised: 10/02/2016 Document Reviewed: 10/02/2016 Elsevier Interactive Patient Education  2017 [employer].                   .         .             Marland Kitchen                 .             ()   .    (32-34 )            .               Marland Kitchen           .                           .           Marland Kitchen                    Marland Kitchen                         Marland Kitchen             .                30  70 :         .    :      .                  :            .    :          .         :         .     :        .     :                 .            .             .        :               .       .            .     .         .             .  .     :  .         :  .     .       :      .       .      ( ).    .         .        .        Marland Kitchen                Marland Kitchen                Marland Kitchen              Marland Kitchen                         Marland Kitchen

## 2017-02-13 NOTE — Patient Instructions (Addendum)
[facility]ILY [place] [address] [place], [place]  [address] Phone - [phone]   Fax - [phone]  [facility] [address] [place], [place] [address] Phone - [phone]   Fax - [place] [address] [place], [place]  [address] Phone - [phone]   Fax - [phone]  [place] [address] [place] [address] [place], [place]  [address] Phone - [phone]   Fax - [phone]  [place] [address] [place], [place]  [address] Phone - [phone]   Fax - [phone]  [facility] [address] [place], [place]  [address] Phone - [phone]   Fax - [place] [address], [place], [place]  [address] Phone - [phone]   Fax - [phone]  [place] [address] [place], [place] [address] [place], [place]  [address] Phone - [phone]   Fax - [place] [address] [place], [place]  [address] Phone - [phone]   Fax - [phone] [facility] [place] [place], [place]  [address] Phone - [phone]   Fax - [phone]  [place] [address] [place], [place]  [address] Phone - [phone]   Fax - [phone]  [facility] [place], [place], [place]  [address] Phone - [phone]   Fax - [place] [address], [place], [place]  [address] Phone - [phone]   Fax - [phone]  [facility] [address] [place], [place]  [address] Phone - [place], [place]  [address] Phone - [phone]   Fax - [place] [address], [place], [place]  [address] Phone - [phone]   Fax - [phone]  [place] [address] [place], [place]  [address] Phone - [phone]   Fax - [place]. [place], [place]  [address] Phone - [phone]   Fax - [place], [place], [place]  [address] Phone - [phone]   Fax - [place] [address], [place], [place]  [address] Phone - [phone]   Fax - [phone]  [place] [address] [place], [place]  [address] Phone - [phone]   Fax - [place] [place], [place]  [address] Phone - [phone]   Fax - [phone]  [place] [address] [place], [place]  [address] Phone - [phone]   Fax - [phone] [place] [address] [place], [place]  [address] Phone - [phone]   Fax - [phone]  [place] [address] [place], [place]  [address] Phone - [phone]   Fax - [place] [address] [place], [place]  [address] Phone - [phone]   Fax [phone]     Fatigue Fatigue is feeling tired all of the time, a lack of energy, or a lack of motivation. Occasional or mild fatigue is often a normal response to activity or life  in general. However, long-lasting (chronic) or extreme fatigue may indicate an underlying medical condition. Follow these instructions at home: Watch your fatigue for any changes. The following actions may help to lessen any discomfort you are feeling:  Talk to your health care provider about how much sleep you need each night. Try to get the required amount every night.  Take medicines only as directed by your health care provider.  Eat a healthy and  nutritious diet. Ask your health care provider if you need help changing your diet.  Drink enough fluid to keep your urine clear or pale yellow.  Practice ways of relaxing, such as yoga, meditation, massage therapy, or acupuncture.  Exercise regularly.  Change situations that cause you stress. Try to keep your work and personal routine reasonable.  Do not abuse illegal drugs.  Limit alcohol intake to no more than 1 drink per day for nonpregnant women and 2 drinks per day for men. One drink equals 12 ounces of beer, 5 ounces of wine, or 1 ounces of hard liquor.  Take a multivitamin, if directed by your health care provider. Contact a health care provider if:  Your fatigue does not get better.  You have a fever.  You have unintentional weight loss or gain.  You have headaches.  You have difficulty:  Falling asleep.  Sleeping throughout the night.  You feel angry, guilty, anxious, or sad.  You are unable to have a bowel movement (constipation).  You skin is dry.  Your legs or another part of your body is swollen. Get help right away if:  You feel confused.  Your vision is blurry.  You feel faint or pass out.  You have a severe headache.  You have severe abdominal, pelvic, or back pain.  You have chest pain, shortness of breath, or an irregular or fast heartbeat.  You are unable to urinate or you urinate less than normal.  You develop abnormal bleeding, such as bleeding from the rectum, vagina, nose, lungs, or nipples.  You vomit blood.  You have thoughts about harming yourself or committing suicide.  You are worried that you might harm someone else. This information is not intended to replace advice given to you by your health care provider. Make sure you discuss any questions you have with your health care provider. Document Released: 09/10/2007 Document Revised: 04/20/2016 Document Reviewed: 03/17/2014 Elsevier Interactive Patient Education  2017  [employer].

## 2017-02-13 NOTE — Progress Notes (Signed)
   PRENATAL VISIT NOTE  Subjective:  Priscilla Lewis is a 36 y.o. S8P1031 at [redacted]w[redacted]d being seen today for ongoing prenatal care.  She is currently monitored for the following issues for this high-risk pregnancy and has AMA (advanced maternal age) multigravida 7+, first trimester; Supervision of normal pregnancy in first trimester; History of cancer of left breast; Language barrier affecting health care; and History of C-section on her problem list.  Patient reports fatigue and heartburn and occasional SOB, w/out cough, congestion, palpitations, wheezing or unilateral leg swelling or pain.  Contractions: Not present. Vag. Bleeding: None.  Movement: Present. Denies leaking of fluid.   The following portions of the patient's history were reviewed and updated as appropriate: allergies, current medications, past family history, past medical history, past social history, past surgical history and problem list. Problem list updated.  Objective:   Vitals:   02/13/17 1152  BP: 112/68  Pulse: (!) 102  Weight: 198 lb 11.2 oz (90.1 kg)    Fetal Status: Fetal Heart Rate (bpm): 146 Fundal Height: 28 cm Movement: Present  Presentation: Vertex  General:  Alert, oriented and cooperative. Patient is in no acute distress.  Skin: Skin is warm and dry. No rash noted.   Cardiovascular: Normal heart rate and rhythm noted  Respiratory: Normal respiratory effort, no problems with respiration noted, CTAB  Abdomen: Soft, gravid, appropriate for gestational age. Pain/Pressure: Absent     Pelvic:  Cervical exam deferred        Extremities: Normal range of motion.  Edema: Moderate pitting, indentation subsides rapidly. Neg Homan's.   Mental Status: Normal mood and affect. Normal behavior. Normal judgment and thought content.   Assessment and Plan:  Pregnancy: [id number] at [redacted]w[redacted]d  1. Gastroesophageal reflux during pregnancy in second trimester, antepartum  - ranitidine (ZANTAC) 150 MG tablet; Take 1 tablet (150 mg  total) by mouth 2 (two) times daily.  Dispense: 60 tablet; Refill: 3  2. Need for Tdap vaccination  - Tdap vaccine greater than or equal to 7yo IM  3. Encounter for supervision of other normal pregnancy in first trimester - 28 week labs  4. History of C-section   5. AMA (advanced maternal age) multigravida 25+, first trimester  6. Fatigue--Suspect normal changes of pregnancy, but SOB precautions reviewed. Pt instructed to go to MAU/ED for worsening SOB, fever, hemoptysis, May need PCP referral.   -CBC  Preterm labor symptoms and general obstetric precautions including but not limited to vaginal bleeding, contractions, leaking of fluid and fetal movement were reviewed in detail with the patient. Please refer to After Visit Summary for other counseling recommendations.  Return in about 2 weeks (around 02/27/2017) for [place].   Manya Silvas, CNM

## 2017-02-13 NOTE — MAU Note (Signed)
Pt states she had an appointment today, received a vaccine & labs.  States when she went home she started bleeding an hour ago.  Also having lower abd pain an hour ago.

## 2017-02-14 LAB — HIV ANTIBODY (ROUTINE TESTING W REFLEX): HIV SCREEN 4TH GENERATION: NONREACTIVE

## 2017-02-14 LAB — CBC
HEMATOCRIT: 35.6 % (ref 34.0–46.6)
Hemoglobin: 11.8 g/dL (ref 11.1–15.9)
MCH: 29.4 pg (ref 26.6–33.0)
MCHC: 33.1 g/dL (ref 31.5–35.7)
MCV: 89 fL (ref 79–97)
Platelets: 150 10*3/uL (ref 150–379)
RBC: 4.02 x10E6/uL (ref 3.77–5.28)
RDW: 13.6 % (ref 12.3–15.4)
WBC: 6 10*3/uL (ref 3.4–10.8)

## 2017-02-14 LAB — GLUCOSE TOLERANCE, 2 HOURS W/ 1HR
GLUCOSE, 1 HOUR: 166 mg/dL (ref 65–179)
GLUCOSE, FASTING: 80 mg/dL (ref 65–91)
Glucose, 2 hour: 106 mg/dL (ref 65–152)

## 2017-02-14 LAB — RPR: RPR Ser Ql: NONREACTIVE

## 2017-02-17 LAB — CULTURE, OB URINE

## 2017-03-06 ENCOUNTER — Ambulatory Visit (INDEPENDENT_AMBULATORY_CARE_PROVIDER_SITE_OTHER): Payer: Self-pay | Admitting: Advanced Practice Midwife

## 2017-03-06 DIAGNOSIS — Z3481 Encounter for supervision of other normal pregnancy, first trimester: Secondary | ICD-10-CM

## 2017-03-06 NOTE — Patient Instructions (Signed)

## 2017-03-06 NOTE — Progress Notes (Signed)
Phone interpreter # [phone]

## 2017-03-06 NOTE — Progress Notes (Signed)
   PRENATAL VISIT NOTE  Subjective:  Priscilla Lewis is a 36 y.o. [id number] at [redacted]w[redacted]d being seen today for ongoing prenatal care.  She is currently monitored for the following issues for this high-risk pregnancy and has AMA (advanced maternal age) multigravida 48+, first trimester; Supervision of normal pregnancy in first trimester; History of cancer of left breast; Language barrier affecting health care; and History of C-section on her problem list.  Patient reports no complaints.  Contractions: Not present. Vag. Bleeding: None.  Movement: Present. Denies leaking of fluid.   The following portions of the patient's history were reviewed and updated as appropriate: allergies, current medications, past family history, past medical history, past social history, past surgical history and problem list. Problem list updated.  Reviewed Nml 28 weeks labs  Objective:   Vitals:   03/06/17 1138  BP: 100/64  Pulse: 100  Weight: 201 lb 14.4 oz (91.6 kg)    Fetal Status: Fetal Heart Rate (bpm): 123 Fundal Height: 31 cm Movement: Present  Presentation: Vertex  General:  Alert, oriented and cooperative. Patient is in no acute distress.  Skin: Skin is warm and dry. No rash noted.   Cardiovascular: Normal heart rate noted  Respiratory: Normal respiratory effort, no problems with respiration noted  Abdomen: Soft, gravid, appropriate for gestational age. Pain/Pressure: Absent     Pelvic:  Cervical exam deferred        Extremities: Normal range of motion.  Edema: Moderate pitting, indentation subsides rapidly  Mental Status: Normal mood and affect. Normal behavior. Normal judgment and thought content.   Assessment and Plan:  Pregnancy: [id number] at [redacted]w[redacted]d  1. Encounter for supervision of other normal pregnancy in first trimester   Preterm labor symptoms and general obstetric precautions including but not limited to vaginal bleeding, contractions, leaking of fluid and fetal movement were reviewed in detail  with the patient. Please refer to After Visit Summary for other counseling recommendations.  Return in about 2 weeks (around 03/20/2017) for ROB.   Manya Silvas, CNM

## 2017-03-20 ENCOUNTER — Ambulatory Visit (INDEPENDENT_AMBULATORY_CARE_PROVIDER_SITE_OTHER): Payer: Self-pay | Admitting: Student

## 2017-03-20 VITALS — BP 97/63 | HR 92 | Wt 203.2 lb

## 2017-03-20 DIAGNOSIS — Z98891 History of uterine scar from previous surgery: Secondary | ICD-10-CM

## 2017-03-20 DIAGNOSIS — Z789 Other specified health status: Secondary | ICD-10-CM

## 2017-03-20 DIAGNOSIS — O212 Late vomiting of pregnancy: Secondary | ICD-10-CM

## 2017-03-20 DIAGNOSIS — Z3483 Encounter for supervision of other normal pregnancy, third trimester: Secondary | ICD-10-CM

## 2017-03-20 DIAGNOSIS — O34219 Maternal care for unspecified type scar from previous cesarean delivery: Secondary | ICD-10-CM

## 2017-03-20 MED ORDER — METOCLOPRAMIDE HCL 10 MG PO TABS: 10.0000 mg | ORAL_TABLET | Freq: Four times a day (QID) | ORAL | 0 refills | Status: DC

## 2017-03-20 MED ORDER — ONDANSETRON 4 MG PO TBDP: 4.0000 mg | ORAL_TABLET | Freq: Three times a day (TID) | ORAL | 0 refills | Status: DC | PRN

## 2017-03-20 MED ORDER — OMEPRAZOLE 20 MG PO CPDR: 20.0000 mg | DELAYED_RELEASE_CAPSULE | Freq: Two times a day (BID) | ORAL | 0 refills | Status: DC

## 2017-03-20 NOTE — Progress Notes (Signed)
Scored high on PHQ-9 but declined to see behavior clinician.

## 2017-03-20 NOTE — Patient Instructions (Signed)

## 2017-03-20 NOTE — Progress Notes (Signed)
    PRENATAL VISIT NOTE  Subjective:  Priscilla Lewis is a 36 y.o. N3Z7673 at [redacted]w[redacted]d being seen today for ongoing prenatal care.  She is currently monitored for the following issues for this high-risk pregnancy and has AMA (advanced maternal age) multigravida 73+, first trimester; Supervision of normal intrauterine pregnancy in multigravida in third trimester; History of cancer of left breast; Language barrier affecting health care; and History of C-section on her problem list.  Patient reports nausea and vomiting.Symptoms began 1 week ago. Reports vomiting after every meal. Denies change in diet. Has been taking zantac BID for the last month. Emesis consists of undigested food and water. Denies abdominal pain, fever, diarrhea, or constipation.  Contractions: Not present. Vag. Bleeding: None.  Movement: Present. Denies leaking of fluid.   The following portions of the patient's history were reviewed and updated as appropriate: allergies, current medications, past family history, past medical history, past social history, past surgical history and problem list. Problem list updated.  Objective:   Vitals:   03/20/17 0938  BP: 97/63  Pulse: 92  Weight: 203 lb 3.2 oz (92.2 kg)    Fetal Status: Fetal Heart Rate (bpm): 122 Fundal Height: 32 cm Movement: Present     General:  Alert, oriented and cooperative. Patient is in no acute distress.  Skin: Skin is warm and dry. No rash noted.   Cardiovascular: Normal heart rate noted  Respiratory: Normal respiratory effort, no problems with respiration noted  Abdomen: Soft, gravid, appropriate for gestational age. Pain/Pressure: Absent     Pelvic:  Cervical exam deferred        Extremities: Normal range of motion.  Edema: Mild pitting, slight indentation  Mental Status: Normal mood and affect. Normal behavior. Normal judgment and thought content.   Assessment and Plan:  Pregnancy: A1P3790 at [redacted]w[redacted]d  1. Supervision of normal intrauterine pregnancy in  multigravida in third trimester   2. Language barrier affecting health care -Arabic video interpreter  3. History of C-section -TOLAC consent in media  4. Vomiting of pregnancy, after 22 weeks -D/c zantac & start OTC omeprazole BID 30 minutes before meals - metoCLOPramide (REGLAN) 10 MG tablet; Take 1 tablet (10 mg total) by mouth every 6 (six) hours.  Dispense: 30 tablet; Refill: 0  Preterm labor symptoms and general obstetric precautions including but not limited to vaginal bleeding, contractions, leaking of fluid and fetal movement were reviewed in detail with the patient. Please refer to After Visit Summary for other counseling recommendations.  Return in about 2 weeks (around 04/03/2017) for Routine OB.   [name], NP

## 2017-04-10 ENCOUNTER — Ambulatory Visit (INDEPENDENT_AMBULATORY_CARE_PROVIDER_SITE_OTHER): Payer: Self-pay | Admitting: Advanced Practice Midwife

## 2017-04-10 ENCOUNTER — Encounter: Payer: Self-pay | Admitting: Advanced Practice Midwife

## 2017-04-10 DIAGNOSIS — O09899 Supervision of other high risk pregnancies, unspecified trimester: Secondary | ICD-10-CM

## 2017-04-10 DIAGNOSIS — O09893 Supervision of other high risk pregnancies, third trimester: Secondary | ICD-10-CM

## 2017-04-10 DIAGNOSIS — Z98891 History of uterine scar from previous surgery: Secondary | ICD-10-CM

## 2017-04-10 DIAGNOSIS — O09523 Supervision of elderly multigravida, third trimester: Secondary | ICD-10-CM

## 2017-04-10 NOTE — Progress Notes (Signed)
   PRENATAL VISIT NOTE  Subjective:  Priscilla Lewis is a 36 y.o. [id number] at [redacted]w[redacted]d being seen today for ongoing prenatal care.  She is currently monitored for the following issues for this high-risk pregnancy and has AMA (advanced maternal age) multigravida 23+, first trimester; Supervision of normal intrauterine pregnancy in multigravida in third trimester; History of cancer of left breast; Language barrier affecting health care; and History of C-section on her problem list.  Patient reports occasional contractions.  Contractions: Irregular. Vag. Bleeding: None.  Movement: Present. Denies leaking of fluid.   The following portions of the patient's history were reviewed and updated as appropriate: allergies, current medications, past family history, past medical history, past social history, past surgical history and problem list. Problem list updated.  Objective:   Vitals:   04/10/17 1157  BP: 104/66  Pulse: 90  Weight: 205 lb 1.6 oz (93 kg)    Fetal Status: Fetal Heart Rate (bpm): 125 Fundal Height: 35 cm Movement: Present     General:  Alert, oriented and cooperative. Patient is in no acute distress.  Skin: Skin is warm and dry. No rash noted.   Cardiovascular: Normal heart rate noted  Respiratory: Normal respiratory effort, no problems with respiration noted  Abdomen: Soft, gravid, appropriate for gestational age. Pain/Pressure: Absent     Pelvic:  Cervical exam deferred        Extremities: Normal range of motion.  Edema: Mild pitting, slight indentation  Mental Status: Normal mood and affect. Normal behavior. Normal judgment and thought content.   Assessment and Plan:  Pregnancy: M3N3614 at [redacted]w[redacted]d  1. Supervision of normal intrauterine pregnancy in multigravida in third trimester   Preterm labor symptoms and general obstetric precautions including but not limited to vaginal bleeding, contractions, leaking of fluid and fetal movement were reviewed in detail with the  patient. Please refer to After Visit Summary for other counseling recommendations.  Return in about 1 week (around 04/17/2017) for ROB/GBS.   Tamala Julian, [place], [place]

## 2017-04-10 NOTE — Patient Instructions (Signed)

## 2017-04-18 ENCOUNTER — Encounter: Payer: Self-pay | Admitting: Advanced Practice Midwife

## 2017-04-26 ENCOUNTER — Ambulatory Visit (INDEPENDENT_AMBULATORY_CARE_PROVIDER_SITE_OTHER): Payer: Medicaid Other | Admitting: Obstetrics and Gynecology

## 2017-04-26 ENCOUNTER — Other Ambulatory Visit (HOSPITAL_COMMUNITY)
Admission: RE | Admit: 2017-04-26 | Discharge: 2017-04-26 | Disposition: A | Discharge status: Home / Self Care | Payer: Medicaid Other | Source: Ambulatory Visit | Attending: Advanced Practice Midwife | Admitting: Advanced Practice Midwife

## 2017-04-26 VITALS — BP 94/59 | HR 87 | Wt 206.3 lb

## 2017-04-26 DIAGNOSIS — O34219 Maternal care for unspecified type scar from previous cesarean delivery: Secondary | ICD-10-CM

## 2017-04-26 DIAGNOSIS — Z3403 Encounter for supervision of normal first pregnancy, third trimester: Secondary | ICD-10-CM | POA: Insufficient documentation

## 2017-04-26 DIAGNOSIS — O09523 Supervision of elderly multigravida, third trimester: Secondary | ICD-10-CM

## 2017-04-26 DIAGNOSIS — Z3402 Encounter for supervision of normal first pregnancy, second trimester: Secondary | ICD-10-CM

## 2017-04-26 DIAGNOSIS — O09521 Supervision of elderly multigravida, first trimester: Secondary | ICD-10-CM

## 2017-04-26 DIAGNOSIS — Z98891 History of uterine scar from previous surgery: Secondary | ICD-10-CM

## 2017-04-26 DIAGNOSIS — Z3A37 37 weeks gestation of pregnancy: Secondary | ICD-10-CM | POA: Insufficient documentation

## 2017-04-26 LAB — OB RESULTS CONSOLE GC/CHLAMYDIA: GC PROBE AMP, GENITAL: NEGATIVE

## 2017-04-26 LAB — OB RESULTS CONSOLE GBS: GBS: POSITIVE

## 2017-04-26 NOTE — Patient Instructions (Signed)
Fetal Movement Counts Patient Name: ________________________________________________ Patient Due Date: ____________________ What is a fetal movement count? A fetal movement count is the number of times that you feel your baby move during a certain amount of time. This may also be called a fetal kick count. A fetal movement count is recommended for every pregnant woman. You may be asked to start counting fetal movements as early as week 28 of your pregnancy. Pay attention to when your baby is most active. You may notice your baby's sleep and wake cycles. You may also notice things that make your baby move more. You should do a fetal movement count:  When your baby is normally most active.  At the same time each day.  A good time to count movements is while you are resting, after having something to eat and drink. How do I count fetal movements? 1. Find a quiet, comfortable area. Sit, or lie down on your side. 2. Write down the date, the start time and stop time, and the number of movements that you felt between those two times. Take this information with you to your health care visits. 3. For 2 hours, count kicks, flutters, swishes, rolls, and jabs. You should feel at least 10 movements during 2 hours. 4. You may stop counting after you have felt 10 movements. 5. If you do not feel 10 movements in 2 hours, have something to eat and drink. Then, keep resting and counting for 1 hour. If you feel at least 4 movements during that hour, you may stop counting. Contact a health care provider if:  You feel fewer than 4 movements in 2 hours.  Your baby is not moving like he or she usually does. Date: ____________ Start time: ____________ Stop time: ____________ Movements: ____________ Date: ____________ Start time: ____________ Stop time: ____________ Movements: ____________ Date: ____________ Start time: ____________ Stop time: ____________ Movements: ____________ Date: ____________ Start time:  ____________ Stop time: ____________ Movements: ____________ Date: ____________ Start time: ____________ Stop time: ____________ Movements: ____________ Date: ____________ Start time: ____________ Stop time: ____________ Movements: ____________ Date: ____________ Start time: ____________ Stop time: ____________ Movements: ____________ Date: ____________ Start time: ____________ Stop time: ____________ Movements: ____________ Date: ____________ Start time: ____________ Stop time: ____________ Movements: ____________ This information is not intended to replace advice given to you by your health care provider. Make sure you discuss any questions you have with your health care provider. Document Released: 12/13/2006 Document Revised: 07/12/2016 Document Reviewed: 12/23/2015 Elsevier Interactive Patient Education  2018 [place] of Pregnancy The third trimester is from week 28 through week 40 (months 7 through 9). The third trimester is a time when the unborn baby (fetus) is growing rapidly. At the end of the ninth month, the fetus is about 20 inches in length and weighs 6-10 pounds. Body changes during your third trimester Your body will continue to go through many changes during pregnancy. The changes vary from woman to woman. During the third trimester:  Your weight will continue to increase. You can expect to gain 25-35 pounds (11-16 kg) by the end of the pregnancy.  You may begin to get stretch marks on your hips, abdomen, and breasts.  You may urinate more often because the fetus is moving lower into your pelvis and pressing on your bladder.  You may develop or continue to have heartburn. This is caused by increased hormones that slow down muscles in the digestive tract.  You may develop or continue to have constipation because increased  hormones slow digestion and cause the muscles that push waste through your intestines to relax.  You may develop hemorrhoids. These are  swollen veins (varicose veins) in the rectum that can itch or be painful.  You may develop swollen, bulging veins (varicose veins) in your legs.  You may have increased body aches in the pelvis, back, or thighs. This is due to weight gain and increased hormones that are relaxing your joints.  You may have changes in your hair. These can include thickening of your hair, rapid growth, and changes in texture. Some women also have hair loss during or after pregnancy, or hair that feels dry or thin. Your hair will most likely return to normal after your baby is born.  Your breasts will continue to grow and they will continue to become tender. A yellow fluid (colostrum) may leak from your breasts. This is the first milk you are producing for your baby.  Your belly button may stick out.  You may notice more swelling in your hands, face, or ankles.  You may have increased tingling or numbness in your hands, arms, and legs. The skin on your belly may also feel numb.  You may feel short of breath because of your expanding uterus.  You may have more problems sleeping. This can be caused by the size of your belly, increased need to urinate, and an increase in your body's metabolism.  You may notice the fetus "dropping," or moving lower in your abdomen (lightening).  You may have increased vaginal discharge.  You may notice your joints feel loose and you may have pain around your pelvic bone.  What to expect at prenatal visits You will have prenatal exams every 2 weeks until week 36. Then you will have weekly prenatal exams. During a routine prenatal visit:  You will be weighed to make sure you and the baby are growing normally.  Your blood pressure will be taken.  Your abdomen will be measured to track your baby's growth.  The fetal heartbeat will be listened to.  Any test results from the previous visit will be discussed.  You may have a cervical check near your due date to see if your  cervix has softened or thinned (effaced).  You will be tested for Group B streptococcus. This happens between 35 and 37 weeks.  Your health care provider may ask you:  What your birth plan is.  How you are feeling.  If you are feeling the baby move.  If you have had any abnormal symptoms, such as leaking fluid, bleeding, severe headaches, or abdominal cramping.  If you are using any tobacco products, including cigarettes, chewing tobacco, and electronic cigarettes.  If you have any questions.  Other tests or screenings that may be performed during your third trimester include:  Blood tests that check for low iron levels (anemia).  Fetal testing to check the health, activity level, and growth of the fetus. Testing is done if you have certain medical conditions or if there are problems during the pregnancy.  Nonstress test (NST). This test checks the health of your baby to make sure there are no signs of problems, such as the baby not getting enough oxygen. During this test, a belt is placed around your belly. The baby is made to move, and its heart rate is monitored during movement.  What is false labor? False labor is a condition in which you feel small, irregular tightenings of the muscles in the womb (contractions) that usually  go away with rest, changing position, or drinking water. These are called Braxton Hicks contractions. Contractions may last for hours, days, or even weeks before true labor sets in. If contractions come at regular intervals, become more frequent, increase in intensity, or become painful, you should see your health care provider. What are the signs of labor?  Abdominal cramps.  Regular contractions that start at 10 minutes apart and become stronger and more frequent with time.  Contractions that start on the top of the uterus and spread down to the lower abdomen and back.  Increased pelvic pressure and dull back pain.  A watery or bloody mucus discharge  that comes from the vagina.  Leaking of amniotic fluid. This is also known as your "water breaking." It could be a slow trickle or a gush. Let your health care provider know if it has a color or strange odor. If you have any of these signs, call your health care provider right away, even if it is before your due date. Follow these instructions at home: Medicines  Follow your health care provider's instructions regarding medicine use. Specific medicines may be either safe or unsafe to take during pregnancy.  Take a prenatal vitamin that contains at least 600 micrograms (mcg) of folic acid.  If you develop constipation, try taking a stool softener if your health care provider approves. Eating and drinking  Eat a balanced diet that includes fresh fruits and vegetables, whole grains, good sources of protein such as meat, eggs, or tofu, and low-fat dairy. Your health care provider will help you determine the amount of weight gain that is right for you.  Avoid raw meat and uncooked cheese. These carry germs that can cause birth defects in the baby.  If you have low calcium intake from food, talk to your health care provider about whether you should take a daily calcium supplement.  Eat four or five small meals rather than three large meals a day.  Limit foods that are high in fat and processed sugars, such as fried and sweet foods.  To prevent constipation: ? Drink enough fluid to keep your urine clear or pale yellow. ? Eat foods that are high in fiber, such as fresh fruits and vegetables, whole grains, and beans. Activity  Exercise only as directed by your health care provider. Most women can continue their usual exercise routine during pregnancy. Try to exercise for 30 minutes at least 5 days a week. Stop exercising if you experience uterine contractions.  Avoid heavy lifting.  Do not exercise in extreme heat or humidity, or at high altitudes.  Wear low-heel, comfortable  shoes.  Practice good posture.  You may continue to have sex unless your health care provider tells you otherwise. Relieving pain and discomfort  Take frequent breaks and rest with your legs elevated if you have leg cramps or low back pain.  Take warm sitz baths to soothe any pain or discomfort caused by hemorrhoids. Use hemorrhoid cream if your health care provider approves.  Wear a good support bra to prevent discomfort from breast tenderness.  If you develop varicose veins: ? Wear support pantyhose or compression stockings as told by your healthcare provider. ? Elevate your feet for 15 minutes, 3-4 times a day. Prenatal care  Write down your questions. Take them to your prenatal visits.  Keep all your prenatal visits as told by your health care provider. This is important. Safety  Wear your seat belt at all times when driving.  Make  a list of emergency phone numbers, including numbers for family, friends, the hospital, and police and fire departments. General instructions  Avoid cat litter boxes and soil used by cats. These carry germs that can cause birth defects in the baby. If you have a cat, ask someone to clean the litter box for you.  Do not travel far distances unless it is absolutely necessary and only with the approval of your health care provider.  Do not use hot tubs, steam rooms, or saunas.  Do not drink alcohol.  Do not use any products that contain nicotine or tobacco, such as cigarettes and e-cigarettes. If you need help quitting, ask your health care provider.  Do not use any medicinal herbs or unprescribed drugs. These chemicals affect the formation and growth of the baby.  Do not douche or use tampons or scented sanitary pads.  Do not cross your legs for long periods of time.  To prepare for the arrival of your baby: ? Take prenatal classes to understand, practice, and ask questions about labor and delivery. ? Make a trial run to the  hospital. ? Visit the hospital and tour the maternity area. ? Arrange for maternity or paternity leave through employers. ? Arrange for family and friends to take care of pets while you are in the hospital. ? Purchase a rear-facing car seat and make sure you know how to install it in your car. ? Pack your hospital bag. ? Prepare the baby's nursery. Make sure to remove all pillows and stuffed animals from the baby's crib to prevent suffocation.  Visit your dentist if you have not gone during your pregnancy. Use a soft toothbrush to brush your teeth and be gentle when you floss. Contact a health care provider if:  You are unsure if you are in labor or if your water has broken.  You become dizzy.  You have mild pelvic cramps, pelvic pressure, or nagging pain in your abdominal area.  You have lower back pain.  You have persistent nausea, vomiting, or diarrhea.  You have an unusual or bad smelling vaginal discharge.  You have pain when you urinate. Get help right away if:  Your water breaks before 37 weeks.  You have regular contractions less than 5 minutes apart before 37 weeks.  You have a fever.  You are leaking fluid from your vagina.  You have spotting or bleeding from your vagina.  You have severe abdominal pain or cramping.  You have rapid weight loss or weight gain.  You have shortness of breath with chest pain.  You notice sudden or extreme swelling of your face, hands, ankles, feet, or legs.  Your baby makes fewer than 10 movements in 2 hours.  You have severe headaches that do not go away when you take medicine.  You have vision changes. Summary  The third trimester is from week 28 through week 40, months 7 through 9. The third trimester is a time when the unborn baby (fetus) is growing rapidly.  During the third trimester, your discomfort may increase as you and your baby continue to gain weight. You may have abdominal, leg, and back pain, sleeping  problems, and an increased need to urinate.  During the third trimester your breasts will keep growing and they will continue to become tender. A yellow fluid (colostrum) may leak from your breasts. This is the first milk you are producing for your baby.  False labor is a condition in which you feel small, irregular tightenings  of the muscles in the womb (contractions) that eventually go away. These are called Braxton Hicks contractions. Contractions may last for hours, days, or even weeks before true labor sets in.  Signs of labor can include: abdominal cramps; regular contractions that start at 10 minutes apart and become stronger and more frequent with time; watery or bloody mucus discharge that comes from the vagina; increased pelvic pressure and dull back pain; and leaking of amniotic fluid. This information is not intended to replace advice given to you by your health care provider. Make sure you discuss any questions you have with your health care provider. Document Released: 11/07/2001 Document Revised: 04/20/2016 Document Reviewed: 01/14/2013 Elsevier Interactive Patient Education  2017 [employer].

## 2017-04-26 NOTE — Addendum Note (Signed)
Addended by: Phillip Heal, DEMETRICE A on: 04/26/2017 05:06 PM   Modules accepted: Orders, SmartSet

## 2017-04-26 NOTE — Addendum Note (Signed)
Addended by: Phillip Heal, Domonic Kimball A on: 04/26/2017 01:09 PM   Modules accepted: Orders

## 2017-04-26 NOTE — Progress Notes (Signed)
   PRENATAL VISIT NOTE  Arabic interpret via Language Line  Subjective:  Priscilla Lewis is a 36 y.o. [id number] at [redacted]w[redacted]d being seen today for ongoing prenatal care.  She is currently monitored for the following issues for this low-risk pregnancy and has AMA (advanced maternal age) multigravida 16+, first trimester; Supervision of other high risk pregnancy, antepartum; History of cancer of left breast; Language barrier affecting health care; and History of C-section on her problem list.  Patient reports occasional contractions.  Contractions: Not present. Vag. Bleeding: None.  Movement: (!) Decreased. Denies leaking of fluid. Describes 2 days of less movement than usual . She is fasting and intends to continue for 14 days (Ramadan).  The following portions of the patient's history were reviewed and updated as appropriate: allergies, current medications, past family history, past medical history, past social history, past surgical history and problem list. Problem list updated. Still desires TOLAC  Objective:   Vitals:   04/26/17 0758  BP: (!) 94/59  Pulse: 87  Weight: 93.6 kg (206 lb 4.8 oz)    Fetal Status: Fetal Heart Rate (bpm): 120   Movement: (!) Decreased     General:  Alert, oriented and cooperative. Patient is in no acute distress.  Skin: Skin is warm and dry. No rash noted.   Cardiovascular: Normal heart rate noted  Respiratory: Normal respiratory effort, no problems with respiration noted  Abdomen: Soft, gravid, appropriate for gestational age. Pain/Pressure: Present     Pelvic:  Cervical exam performed      posterior FT to closed, thick, cephalic ballotable  Extremities: Normal range of motion.  Edema: Mild pitting, slight indentation  Mental Status: Normal mood and affect. Normal behavior. Normal judgment and thought content.  NST reactive Assessment and Plan:  Pregnancy: [id number] at [redacted]w[redacted]d Fetal well-being by NST 1. History of C-section   2. AMA (advanced maternal age)  multigravida 67+, first trimester   3. Supervision of low-risk first pregnancy, second trimester     Term labor symptoms and general obstetric precautions including but not limited to vaginal bleeding, contractions, leaking of fluid and fetal movement were reviewed in detail with the patient. Please refer to After Visit Summary for other counseling recommendations.  Return in about 1 week (around 05/03/2017).   Candis Musa, CNM

## 2017-04-26 NOTE — Progress Notes (Signed)
Arabic video interpreter "Jana Half" [phone] used for visit

## 2017-04-26 NOTE — Progress Notes (Signed)
Video interpreter Hind # [id number] used for encounter for NST.  Pt reports decreased intensity of fetal movement  x2 days. She denies decreased frequency. Pt is observing fasting rituals of Ramadan and currently has a meal daily @ 0400 then does not eat or drink anything until 8:30-9 pm. She felt good FM during NST.

## 2017-04-29 LAB — CULTURE, BETA STREP (GROUP B ONLY): Strep Gp B Culture: POSITIVE — AB

## 2017-04-30 LAB — GC/CHLAMYDIA PROBE AMP (~~LOC~~) NOT AT ARMC
Chlamydia: NEGATIVE
NEISSERIA GONORRHEA: NEGATIVE

## 2017-05-01 ENCOUNTER — Ambulatory Visit (INDEPENDENT_AMBULATORY_CARE_PROVIDER_SITE_OTHER): Payer: Medicaid Other | Admitting: Medical

## 2017-05-01 VITALS — BP 108/65 | HR 81 | Wt 207.0 lb

## 2017-05-01 DIAGNOSIS — Z98891 History of uterine scar from previous surgery: Secondary | ICD-10-CM

## 2017-05-01 DIAGNOSIS — O09899 Supervision of other high risk pregnancies, unspecified trimester: Secondary | ICD-10-CM

## 2017-05-01 DIAGNOSIS — Z3483 Encounter for supervision of other normal pregnancy, third trimester: Secondary | ICD-10-CM

## 2017-05-01 DIAGNOSIS — Z3402 Encounter for supervision of normal first pregnancy, second trimester: Secondary | ICD-10-CM

## 2017-05-01 DIAGNOSIS — O98819 Other maternal infectious and parasitic diseases complicating pregnancy, unspecified trimester: Secondary | ICD-10-CM

## 2017-05-01 DIAGNOSIS — B951 Streptococcus, group B, as the cause of diseases classified elsewhere: Secondary | ICD-10-CM

## 2017-05-01 DIAGNOSIS — O34219 Maternal care for unspecified type scar from previous cesarean delivery: Secondary | ICD-10-CM

## 2017-05-01 NOTE — Patient Instructions (Signed)
Fetal Movement Counts °Patient Name: ________________________________________________ Patient Due Date: ____________________ °What is a fetal movement count? °A fetal movement count is the number of times that you feel your baby move during a certain amount of time. This may also be called a fetal kick count. A fetal movement count is recommended for every pregnant woman. You may be asked to start counting fetal movements as early as week 28 of your pregnancy. °Pay attention to when your baby is most active. You may notice your baby's sleep and wake cycles. You may also notice things that make your baby move more. You should do a fetal movement count: °· When your baby is normally most active. °· At the same time each day. ° °A good time to count movements is while you are resting, after having something to eat and drink. °How do I count fetal movements? °1. Find a quiet, comfortable area. Sit, or lie down on your side. °2. Write down the date, the start time and stop time, and the number of movements that you felt between those two times. Take this information with you to your health care visits. °3. For 2 hours, count kicks, flutters, swishes, rolls, and jabs. You should feel at least 10 movements during 2 hours. °4. You may stop counting after you have felt 10 movements. °5. If you do not feel 10 movements in 2 hours, have something to eat and drink. Then, keep resting and counting for 1 hour. If you feel at least 4 movements during that hour, you may stop counting. °Contact a health care provider if: °· You feel fewer than 4 movements in 2 hours. °· Your baby is not moving like he or she usually does. °Date: ____________ Start time: ____________ Stop time: ____________ Movements: ____________ °Date: ____________ Start time: ____________ Stop time: ____________ Movements: ____________ °Date: ____________ Start time: ____________ Stop time: ____________ Movements: ____________ °Date: ____________ Start time:  ____________ Stop time: ____________ Movements: ____________ °Date: ____________ Start time: ____________ Stop time: ____________ Movements: ____________ °Date: ____________ Start time: ____________ Stop time: ____________ Movements: ____________ °Date: ____________ Start time: ____________ Stop time: ____________ Movements: ____________ °Date: ____________ Start time: ____________ Stop time: ____________ Movements: ____________ °Date: ____________ Start time: ____________ Stop time: ____________ Movements: ____________ °This information is not intended to replace advice given to you by your health care provider. Make sure you discuss any questions you have with your health care provider. °Document Released: 12/13/2006 Document Revised: 07/12/2016 Document Reviewed: 12/23/2015 °Elsevier Interactive Patient Education © 2018 [employer] °Braxton Hicks Contractions °Contractions of the uterus can occur throughout pregnancy, but they are not always a sign that you are in labor. You may have practice contractions called Braxton Hicks contractions. These false labor contractions are sometimes confused with true labor. °What are Braxton Hicks contractions? °Braxton Hicks contractions are tightening movements that occur in the muscles of the uterus before labor. Unlike true labor contractions, these contractions do not result in opening (dilation) and thinning of the cervix. Toward the end of pregnancy (32-34 weeks), Braxton Hicks contractions can happen more often and may become stronger. These contractions are sometimes difficult to tell apart from true labor because they can be very uncomfortable. You should not feel embarrassed if you go to the hospital with false labor. °Sometimes, the only way to tell if you are in true labor is for your health care provider to look for changes in the cervix. The health care provider will do a physical exam and may monitor your contractions. If   you are not in true labor, the exam  should show that your cervix is not dilating and your water has not broken. °If there are no prenatal problems or other health problems associated with your pregnancy, it is completely safe for you to be sent home with false labor. You may continue to have Braxton Hicks contractions until you go into true labor. °How can I tell the difference between true labor and false labor? °· Differences °? False labor °? Contractions last 30-70 seconds.: Contractions are usually shorter and not as strong as true labor contractions. °? Contractions become very regular.: Contractions are usually irregular. °? Discomfort is usually felt in the top of the uterus, and it spreads to the lower abdomen and low back.: Contractions are often felt in the front of the lower abdomen and in the groin. °? Contractions do not go away with walking.: Contractions may go away when you walk around or change positions while lying down. °? Contractions usually become more intense and increase in frequency.: Contractions get weaker and are shorter-lasting as time goes on. °? The cervix dilates and gets thinner.: The cervix usually does not dilate or become thin. °Follow these instructions at home: °· Take over-the-counter and prescription medicines only as told by your health care provider. °· Keep up with your usual exercises and follow other instructions from your health care provider. °· Eat and drink lightly if you think you are going into labor. °· If Braxton Hicks contractions are making you uncomfortable: °? Change your position from lying down or resting to walking, or change from walking to resting. °? Sit and rest in a tub of warm water. °? Drink enough fluid to keep your urine clear or pale yellow. Dehydration may cause these contractions. °? Do slow and deep breathing several times an hour. °· Keep all follow-up prenatal visits as told by your health care provider. This is important. °Contact a health care provider if: °· You have a  fever. °· You have continuous pain in your abdomen. °Get help right away if: °· Your contractions become stronger, more regular, and closer together. °· You have fluid leaking or gushing from your vagina. °· You pass blood-tinged mucus (bloody show). °· You have bleeding from your vagina. °· You have low back pain that you never had before. °· You feel your baby’s head pushing down and causing pelvic pressure. °· Your baby is not moving inside you as much as it used to. °Summary °· Contractions that occur before labor are called Braxton Hicks contractions, false labor, or practice contractions. °· Braxton Hicks contractions are usually shorter, weaker, farther apart, and less regular than true labor contractions. True labor contractions usually become progressively stronger and regular and they become more frequent. °· Manage discomfort from Braxton Hicks contractions by changing position, resting in a warm bath, drinking plenty of water, or practicing deep breathing. °This information is not intended to replace advice given to you by your health care provider. Make sure you discuss any questions you have with your health care provider. °Document Released: 11/13/2005 Document Revised: 10/02/2016 Document Reviewed: 10/02/2016 °Elsevier Interactive Patient Education © 2017 Elsevier Inc. ° °

## 2017-05-01 NOTE — Progress Notes (Signed)
   PRENATAL VISIT NOTE  Subjective:  Priscilla Lewis is a 36 y.o. [id number] at [redacted]w[redacted]d being seen today for ongoing prenatal care.  She is currently monitored for the following issues for this high-risk pregnancy and has AMA (advanced maternal age) multigravida 4+, first trimester; Supervision of other high risk pregnancy, antepartum; History of cancer of left breast; Language barrier affecting health care; History of C-section; and Group B streptococcal infection during pregnancy on her problem list.  Patient reports no complaints.  Contractions: Irritability. Vag. Bleeding: None.  Movement: Present. Denies leaking of fluid.   The following portions of the patient's history were reviewed and updated as appropriate: allergies, current medications, past family history, past medical history, past social history, past surgical history and problem list. Problem list updated.  Objective:   Vitals:   05/01/17 1056  BP: 108/65  Pulse: 81  Weight: 207 lb (93.9 kg)    Fetal Status: Fetal Heart Rate (bpm): 130 Fundal Height: 39 cm Movement: Present  Presentation: Vertex  General:  Alert, oriented and cooperative. Patient is in no acute distress.  Skin: Skin is warm and dry. No rash noted.   Cardiovascular: Normal heart rate noted  Respiratory: Normal respiratory effort, no problems with respiration noted  Abdomen: Soft, gravid, appropriate for gestational age. Pain/Pressure: Present     Pelvic:  Cervical exam performed Dilation: Fingertip Effacement (%): 80 Station: -2  Extremities: Normal range of motion.  Edema: Mild pitting, slight indentation  Mental Status: Normal mood and affect. Normal behavior. Normal judgment and thought content.   Assessment and Plan:  Pregnancy: [id number] at [redacted]w[redacted]d  1. History of C-section TOLAC planned, consent signed 11/14/16   2. Supervision of low-risk first pregnancy, second trimester Doing well, no complaints   3. Group B streptococcal infection during  pregnancy Needs treatment in labor  Term labor symptoms and general obstetric precautions including but not limited to vaginal bleeding, contractions, leaking of fluid and fetal movement were reviewed in detail with the patient. Please refer to After Visit Summary for other counseling recommendations.  Return in about 1 week (around 05/08/2017) for LOB.   Kerry Hough, PA-C

## 2017-05-10 ENCOUNTER — Ambulatory Visit (INDEPENDENT_AMBULATORY_CARE_PROVIDER_SITE_OTHER): Payer: Medicaid Other | Admitting: Medical

## 2017-05-10 VITALS — BP 112/71 | HR 81 | Wt 206.0 lb

## 2017-05-10 DIAGNOSIS — O09893 Supervision of other high risk pregnancies, third trimester: Secondary | ICD-10-CM

## 2017-05-10 DIAGNOSIS — Z98891 History of uterine scar from previous surgery: Secondary | ICD-10-CM

## 2017-05-10 DIAGNOSIS — B951 Streptococcus, group B, as the cause of diseases classified elsewhere: Secondary | ICD-10-CM

## 2017-05-10 DIAGNOSIS — O98819 Other maternal infectious and parasitic diseases complicating pregnancy, unspecified trimester: Secondary | ICD-10-CM

## 2017-05-10 DIAGNOSIS — O34219 Maternal care for unspecified type scar from previous cesarean delivery: Secondary | ICD-10-CM

## 2017-05-10 DIAGNOSIS — O98813 Other maternal infectious and parasitic diseases complicating pregnancy, third trimester: Secondary | ICD-10-CM

## 2017-05-10 DIAGNOSIS — O09899 Supervision of other high risk pregnancies, unspecified trimester: Secondary | ICD-10-CM

## 2017-05-10 NOTE — Patient Instructions (Signed)
Fetal Movement Counts °Patient Name: ________________________________________________ Patient Due Date: ____________________ °What is a fetal movement count? °A fetal movement count is the number of times that you feel your baby move during a certain amount of time. This may also be called a fetal kick count. A fetal movement count is recommended for every pregnant woman. You may be asked to start counting fetal movements as early as week 28 of your pregnancy. °Pay attention to when your baby is most active. You may notice your baby's sleep and wake cycles. You may also notice things that make your baby move more. You should do a fetal movement count: °· When your baby is normally most active. °· At the same time each day. ° °A good time to count movements is while you are resting, after having something to eat and drink. °How do I count fetal movements? °1. Find a quiet, comfortable area. Sit, or lie down on your side. °2. Write down the date, the start time and stop time, and the number of movements that you felt between those two times. Take this information with you to your health care visits. °3. For 2 hours, count kicks, flutters, swishes, rolls, and jabs. You should feel at least 10 movements during 2 hours. °4. You may stop counting after you have felt 10 movements. °5. If you do not feel 10 movements in 2 hours, have something to eat and drink. Then, keep resting and counting for 1 hour. If you feel at least 4 movements during that hour, you may stop counting. °Contact a health care provider if: °· You feel fewer than 4 movements in 2 hours. °· Your baby is not moving like he or she usually does. °Date: ____________ Start time: ____________ Stop time: ____________ Movements: ____________ °Date: ____________ Start time: ____________ Stop time: ____________ Movements: ____________ °Date: ____________ Start time: ____________ Stop time: ____________ Movements: ____________ °Date: ____________ Start time:  ____________ Stop time: ____________ Movements: ____________ °Date: ____________ Start time: ____________ Stop time: ____________ Movements: ____________ °Date: ____________ Start time: ____________ Stop time: ____________ Movements: ____________ °Date: ____________ Start time: ____________ Stop time: ____________ Movements: ____________ °Date: ____________ Start time: ____________ Stop time: ____________ Movements: ____________ °Date: ____________ Start time: ____________ Stop time: ____________ Movements: ____________ °This information is not intended to replace advice given to you by your health care provider. Make sure you discuss any questions you have with your health care provider. °Document Released: 12/13/2006 Document Revised: 07/12/2016 Document Reviewed: 12/23/2015 °Elsevier Interactive Patient Education © 2018 [employer] °Braxton Hicks Contractions °Contractions of the uterus can occur throughout pregnancy, but they are not always a sign that you are in labor. You may have practice contractions called Braxton Hicks contractions. These false labor contractions are sometimes confused with true labor. °What are Braxton Hicks contractions? °Braxton Hicks contractions are tightening movements that occur in the muscles of the uterus before labor. Unlike true labor contractions, these contractions do not result in opening (dilation) and thinning of the cervix. Toward the end of pregnancy (32-34 weeks), Braxton Hicks contractions can happen more often and may become stronger. These contractions are sometimes difficult to tell apart from true labor because they can be very uncomfortable. You should not feel embarrassed if you go to the hospital with false labor. °Sometimes, the only way to tell if you are in true labor is for your health care provider to look for changes in the cervix. The health care provider will do a physical exam and may monitor your contractions. If   you are not in true labor, the exam  should show that your cervix is not dilating and your water has not broken. °If there are no prenatal problems or other health problems associated with your pregnancy, it is completely safe for you to be sent home with false labor. You may continue to have Braxton Hicks contractions until you go into true labor. °How can I tell the difference between true labor and false labor? °· Differences °? False labor °? Contractions last 30-70 seconds.: Contractions are usually shorter and not as strong as true labor contractions. °? Contractions become very regular.: Contractions are usually irregular. °? Discomfort is usually felt in the top of the uterus, and it spreads to the lower abdomen and low back.: Contractions are often felt in the front of the lower abdomen and in the groin. °? Contractions do not go away with walking.: Contractions may go away when you walk around or change positions while lying down. °? Contractions usually become more intense and increase in frequency.: Contractions get weaker and are shorter-lasting as time goes on. °? The cervix dilates and gets thinner.: The cervix usually does not dilate or become thin. °Follow these instructions at home: °· Take over-the-counter and prescription medicines only as told by your health care provider. °· Keep up with your usual exercises and follow other instructions from your health care provider. °· Eat and drink lightly if you think you are going into labor. °· If Braxton Hicks contractions are making you uncomfortable: °? Change your position from lying down or resting to walking, or change from walking to resting. °? Sit and rest in a tub of warm water. °? Drink enough fluid to keep your urine clear or pale yellow. Dehydration may cause these contractions. °? Do slow and deep breathing several times an hour. °· Keep all follow-up prenatal visits as told by your health care provider. This is important. °Contact a health care provider if: °· You have a  fever. °· You have continuous pain in your abdomen. °Get help right away if: °· Your contractions become stronger, more regular, and closer together. °· You have fluid leaking or gushing from your vagina. °· You pass blood-tinged mucus (bloody show). °· You have bleeding from your vagina. °· You have low back pain that you never had before. °· You feel your baby’s head pushing down and causing pelvic pressure. °· Your baby is not moving inside you as much as it used to. °Summary °· Contractions that occur before labor are called Braxton Hicks contractions, false labor, or practice contractions. °· Braxton Hicks contractions are usually shorter, weaker, farther apart, and less regular than true labor contractions. True labor contractions usually become progressively stronger and regular and they become more frequent. °· Manage discomfort from Braxton Hicks contractions by changing position, resting in a warm bath, drinking plenty of water, or practicing deep breathing. °This information is not intended to replace advice given to you by your health care provider. Make sure you discuss any questions you have with your health care provider. °Document Released: 11/13/2005 Document Revised: 10/02/2016 Document Reviewed: 10/02/2016 °Elsevier Interactive Patient Education © 2017 Elsevier Inc. ° °

## 2017-05-10 NOTE — Progress Notes (Signed)
   PRENATAL VISIT NOTE  Subjective:  Priscilla Lewis is a 36 y.o. [id number] at [redacted]w[redacted]d being seen today for ongoing prenatal care.  She is currently monitored for the following issues for this high-risk pregnancy and has AMA (advanced maternal age) multigravida 56+, first trimester; Supervision of other high risk pregnancy, antepartum; History of cancer of left breast; Language barrier affecting health care; History of C-section; and Group B streptococcal infection during pregnancy on her problem list.  Patient reports spotting.  Contractions: Irregular. Vag. Bleeding: Scant.  Movement: Present. Denies leaking of fluid.   The following portions of the patient's history were reviewed and updated as appropriate: allergies, current medications, past family history, past medical history, past social history, past surgical history and problem list. Problem list updated.  Objective:   Vitals:   05/10/17 0919  BP: 112/71  Pulse: 81  Weight: 206 lb (93.4 kg)    Fetal Status: Fetal Heart Rate (bpm): 135 Fundal Height: 40 cm Movement: Present  Presentation: Vertex  General:  Alert, oriented and cooperative. Patient is in no acute distress.  Skin: Skin is warm and dry. No rash noted.   Cardiovascular: Normal heart rate noted  Respiratory: Normal respiratory effort, no problems with respiration noted  Abdomen: Soft, gravid, appropriate for gestational age. Pain/Pressure: Absent     Pelvic:  Cervical exam performed Dilation: 1 Effacement (%): 80 Station: -2  Extremities: Normal range of motion.  Edema: Mild pitting, slight indentation  Mental Status: Normal mood and affect. Normal behavior. Normal judgment and thought content.   Assessment and Plan:  Pregnancy: O1L5726 at [redacted]w[redacted]d  1. Supervision of other high risk pregnancy, antepartum - Patient doing well - Induction scheduled for post dates if needed  2. History of C-section - TOLAC consent signed  3. Group B streptococcal infection during  pregnancy - Will need treatment in labor  Term labor symptoms and general obstetric precautions including but not limited to vaginal bleeding, contractions, leaking of fluid and fetal movement were reviewed in detail with the patient. Please refer to After Visit Summary for other counseling recommendations.  Return in about 1 week (around 05/17/2017) for LOB and NST.   Kerry Hough, PA-C

## 2017-05-14 ENCOUNTER — Encounter (HOSPITAL_COMMUNITY): Payer: Self-pay | Admitting: *Deleted

## 2017-05-14 ENCOUNTER — Encounter: Payer: Medicaid Other | Admitting: Obstetrics and Gynecology

## 2017-05-14 ENCOUNTER — Encounter: Payer: Medicaid Other | Admitting: Advanced Practice Midwife

## 2017-05-14 ENCOUNTER — Inpatient Hospital Stay (HOSPITAL_COMMUNITY): Payer: Medicaid Other | Admitting: Anesthesiology

## 2017-05-14 ENCOUNTER — Ambulatory Visit (HOSPITAL_COMMUNITY)
Admission: RE | Admit: 2017-05-14 | Discharge: 2017-05-14 | Disposition: A | Discharge status: Home / Self Care | Payer: Medicaid Other | Source: Ambulatory Visit | Attending: Obstetrics and Gynecology | Admitting: Obstetrics and Gynecology

## 2017-05-14 ENCOUNTER — Ambulatory Visit (INDEPENDENT_AMBULATORY_CARE_PROVIDER_SITE_OTHER): Payer: Medicaid Other | Admitting: Obstetrics and Gynecology

## 2017-05-14 ENCOUNTER — Encounter: Payer: Medicaid Other | Admitting: Obstetrics & Gynecology

## 2017-05-14 ENCOUNTER — Inpatient Hospital Stay (HOSPITAL_COMMUNITY)
Admission: AD | Admit: 2017-05-14 | Discharge: 2017-05-17 | DRG: 775 | Disposition: A | Discharge status: Home / Self Care | Payer: Medicaid Other | Source: Ambulatory Visit | Attending: Family Medicine | Admitting: Family Medicine

## 2017-05-14 DIAGNOSIS — O480 Post-term pregnancy: Secondary | ICD-10-CM

## 2017-05-14 DIAGNOSIS — O368130 Decreased fetal movements, third trimester, not applicable or unspecified: Secondary | ICD-10-CM

## 2017-05-14 DIAGNOSIS — O34211 Maternal care for low transverse scar from previous cesarean delivery: Secondary | ICD-10-CM | POA: Diagnosis present

## 2017-05-14 DIAGNOSIS — O34219 Maternal care for unspecified type scar from previous cesarean delivery: Secondary | ICD-10-CM

## 2017-05-14 DIAGNOSIS — Z98891 History of uterine scar from previous surgery: Secondary | ICD-10-CM

## 2017-05-14 DIAGNOSIS — O09523 Supervision of elderly multigravida, third trimester: Secondary | ICD-10-CM | POA: Insufficient documentation

## 2017-05-14 DIAGNOSIS — O288 Other abnormal findings on antenatal screening of mother: Secondary | ICD-10-CM | POA: Insufficient documentation

## 2017-05-14 DIAGNOSIS — Z3A40 40 weeks gestation of pregnancy: Secondary | ICD-10-CM | POA: Insufficient documentation

## 2017-05-14 DIAGNOSIS — O09899 Supervision of other high risk pregnancies, unspecified trimester: Secondary | ICD-10-CM

## 2017-05-14 DIAGNOSIS — Z853 Personal history of malignant neoplasm of breast: Secondary | ICD-10-CM | POA: Diagnosis not present

## 2017-05-14 DIAGNOSIS — O99824 Streptococcus B carrier state complicating childbirth: Secondary | ICD-10-CM | POA: Diagnosis present

## 2017-05-14 LAB — CBC
HEMATOCRIT: 38.5 % (ref 36.0–46.0)
HEMOGLOBIN: 13.1 g/dL (ref 12.0–15.0)
MCH: 29 pg (ref 26.0–34.0)
MCHC: 34 g/dL (ref 30.0–36.0)
MCV: 85.4 fL (ref 78.0–100.0)
Platelets: 157 10*3/uL (ref 150–400)
RBC: 4.51 MIL/uL (ref 3.87–5.11)
RDW: 15 % (ref 11.5–15.5)
WBC: 6.4 10*3/uL (ref 4.0–10.5)

## 2017-05-14 LAB — FETAL NONSTRESS TEST

## 2017-05-14 LAB — TYPE AND SCREEN
ABO/RH(D): B POS
ANTIBODY SCREEN: NEGATIVE

## 2017-05-14 LAB — ABO/RH: ABO/RH(D): B POS

## 2017-05-14 MED ORDER — FENTANYL 2.5 MCG/ML BUPIVACAINE 1/10 % EPIDURAL INFUSION (WH - ANES)
14.0000 mL/h | INTRAMUSCULAR | Status: DC | PRN
  Administered 2017-05-14: 14 mL/h via EPIDURAL
  Filled 2017-05-14: qty 100

## 2017-05-14 MED ORDER — PHENYLEPHRINE 40 MCG/ML (10ML) SYRINGE FOR IV PUSH (FOR BLOOD PRESSURE SUPPORT)
80.0000 ug | PREFILLED_SYRINGE | INTRAVENOUS | Status: DC | PRN
  Filled 2017-05-14: qty 10
  Filled 2017-05-14: qty 5

## 2017-05-14 MED ORDER — OXYTOCIN 40 UNITS IN LACTATED RINGERS INFUSION - SIMPLE MED
2.5000 [IU]/h | INTRAVENOUS | Status: DC
  Administered 2017-05-15: 2.5 [IU]/h via INTRAVENOUS

## 2017-05-14 MED ORDER — LACTATED RINGERS IV SOLN
INTRAVENOUS | Status: DC
  Administered 2017-05-14 (×3): via INTRAVENOUS

## 2017-05-14 MED ORDER — TERBUTALINE SULFATE 1 MG/ML IJ SOLN
0.2500 mg | Freq: Once | INTRAMUSCULAR | Status: DC | PRN
  Filled 2017-05-14: qty 1

## 2017-05-14 MED ORDER — LIDOCAINE HCL (PF) 1 % IJ SOLN
30.0000 mL | INTRAMUSCULAR | Status: DC | PRN
  Filled 2017-05-14: qty 30

## 2017-05-14 MED ORDER — LIDOCAINE HCL (PF) 1 % IJ SOLN
INTRAMUSCULAR | Status: DC | PRN
  Administered 2017-05-14 (×2): 7 mL via EPIDURAL

## 2017-05-14 MED ORDER — LACTATED RINGERS IV SOLN
500.0000 mL | INTRAVENOUS | Status: DC | PRN
  Administered 2017-05-14 (×2): 500 mL via INTRAVENOUS

## 2017-05-14 MED ORDER — OXYCODONE-ACETAMINOPHEN 5-325 MG PO TABS: 1.0000 | ORAL_TABLET | ORAL | Status: DC | PRN

## 2017-05-14 MED ORDER — FENTANYL CITRATE (PF) 100 MCG/2ML IJ SOLN
100.0000 ug | INTRAMUSCULAR | Status: DC | PRN
  Administered 2017-05-14: 100 ug via INTRAVENOUS
  Filled 2017-05-14: qty 2

## 2017-05-14 MED ORDER — DIPHENHYDRAMINE HCL 50 MG/ML IJ SOLN: 12.5000 mg | INTRAMUSCULAR | Status: DC | PRN

## 2017-05-14 MED ORDER — EPHEDRINE 5 MG/ML INJ
10.0000 mg | INTRAVENOUS | Status: DC | PRN
Start: 2017-05-14 — End: 2017-05-15
  Filled 2017-05-14: qty 2

## 2017-05-14 MED ORDER — PHENYLEPHRINE 40 MCG/ML (10ML) SYRINGE FOR IV PUSH (FOR BLOOD PRESSURE SUPPORT)
80.0000 ug | PREFILLED_SYRINGE | INTRAVENOUS | Status: DC | PRN
  Filled 2017-05-14: qty 5

## 2017-05-14 MED ORDER — OXYTOCIN 40 UNITS IN LACTATED RINGERS INFUSION - SIMPLE MED
1.0000 m[IU]/min | INTRAVENOUS | Status: DC
Start: 2017-05-14 — End: 2017-05-15
  Administered 2017-05-14: 6 m[IU]/min via INTRAVENOUS
  Administered 2017-05-14: 2 m[IU]/min via INTRAVENOUS
  Filled 2017-05-14: qty 1000

## 2017-05-14 MED ORDER — OXYTOCIN BOLUS FROM INFUSION
500.0000 mL | Freq: Once | INTRAVENOUS | Status: AC
  Administered 2017-05-15: 500 mL via INTRAVENOUS

## 2017-05-14 MED ORDER — EPHEDRINE 5 MG/ML INJ
10.0000 mg | INTRAVENOUS | Status: DC | PRN
  Filled 2017-05-14: qty 2

## 2017-05-14 MED ORDER — LACTATED RINGERS IV SOLN
500.0000 mL | Freq: Once | INTRAVENOUS | Status: AC
  Administered 2017-05-14: 500 mL via INTRAVENOUS

## 2017-05-14 MED ORDER — SOD CITRATE-CITRIC ACID 500-334 MG/5ML PO SOLN
30.0000 mL | ORAL | Status: DC | PRN
  Filled 2017-05-14: qty 15

## 2017-05-14 MED ORDER — ONDANSETRON HCL 4 MG/2ML IJ SOLN
4.0000 mg | Freq: Four times a day (QID) | INTRAMUSCULAR | Status: DC | PRN
  Administered 2017-05-15: 4 mg via INTRAVENOUS
  Filled 2017-05-14: qty 2

## 2017-05-14 MED ORDER — OXYCODONE-ACETAMINOPHEN 5-325 MG PO TABS: 2.0000 | ORAL_TABLET | ORAL | Status: DC | PRN

## 2017-05-14 MED ORDER — PENICILLIN G POT IN DEXTROSE 60000 UNIT/ML IV SOLN
3.0000 10*6.[IU] | INTRAVENOUS | Status: DC
  Administered 2017-05-14 (×2): 3 10*6.[IU] via INTRAVENOUS
  Filled 2017-05-14 (×6): qty 50

## 2017-05-14 MED ORDER — ACETAMINOPHEN 325 MG PO TABS: 650.0000 mg | ORAL_TABLET | ORAL | Status: DC | PRN

## 2017-05-14 MED ORDER — PENICILLIN G POTASSIUM 5000000 UNITS IJ SOLR
5.0000 10*6.[IU] | Freq: Once | INTRAVENOUS | Status: AC
  Administered 2017-05-14: 5 10*6.[IU] via INTRAVENOUS
  Filled 2017-05-14: qty 5

## 2017-05-14 MED ORDER — FLEET ENEMA 7-19 GM/118ML RE ENEM: 1.0000 | ENEMA | RECTAL | Status: DC | PRN

## 2017-05-14 NOTE — Anesthesia Procedure Notes (Signed)
Epidural Patient location during procedure: OB Start time: 05/14/2017 4:04 PM End time: 05/14/2017 4:08 PM  Staffing Anesthesiologist: Lyn Hollingshead Performed: anesthesiologist   Preanesthetic Checklist Completed: patient identified, site marked, surgical consent, pre-op evaluation, timeout performed, IV checked, risks and benefits discussed and monitors and equipment checked  Epidural Patient position: sitting Prep: site prepped and draped and DuraPrep Patient monitoring: continuous pulse ox and blood pressure Approach: midline Location: L3-L4 Injection technique: LOR air  Needle:  Needle type: Tuohy  Needle gauge: 17 G Needle length: 9 cm and 9 Needle insertion depth: 7 cm Catheter type: closed end flexible Catheter size: 19 Gauge Catheter at skin depth: 12 cm Test dose: negative and Other  Assessment Sensory level: T9 Events: blood not aspirated, injection not painful, no injection resistance, negative IV test and no paresthesia

## 2017-05-14 NOTE — Anesthesia Pain Management Evaluation Note (Signed)
  CRNA Pain Management Visit Note  Patient: Priscilla Lewis, 36 y.o., female  "Hello I am a member of the anesthesia team at [facility]. We have an anesthesia team available at all times to provide care throughout the hospital, including epidural management and anesthesia for C-section. I don't know your plan for the delivery whether it a natural birth, water birth, IV sedation, nitrous supplementation, doula or epidural, but we want to meet your pain goals."   1.Was your pain managed to your expectations on prior hospitalizations?   Yes   2.What is your expectation for pain management during this hospitalization?     Epidural  3.How can we help you reach that goal? unsure  Record the patient's initial score and the patient's pain goal.   Pain: 6  Pain Goal: 5 [facility] wants you to be able to say your pain was always managed very well.  Casimer Lanius 05/14/2017

## 2017-05-14 NOTE — H&P (Signed)
LABOR AND DELIVERY ADMISSION HISTORY AND PHYSICAL NOTE  Priscilla Lewis is a 37 y.o. female [phone] with IUP at [redacted]w[redacted]d by LMP presenting for IOL due to non-reactive NST in office and low baseline FHR.   Patient was seen in office this AM, had an NST due to today is her due date. During NST, it was found to be non-reactive, and decreased fetal movement. Was sent for BPP, technician saw breathing, movement, good fluid, but stopped BPP after about 10 minutes, and it was decided to proceed with induction of labor. She is feeling contractions almost every 5 minutes but are moderately strong.   She denies leakage of fluid or vaginal bleeding.  Prenatal History/Complications:  Previously two vaginal deliveries, followed by a cesarean due to NRFHT with tight nuchal cord.   Past Medical History: Past Medical History:  Diagnosis Date  . Cancer [facility]) 2011   cleared per scan 05/2016 in [place]    Past Surgical History: Past Surgical History:  Procedure Laterality Date  . BREAST SURGERY Left 08/2009   mastectomy  . CESAREAN SECTION      Obstetrical History: OB History    Gravida Para Term Preterm AB Living   5 3 3   1 3    SAB TAB Ectopic Multiple Live Births     1     3      Obstetric Comments   TAB as recommended by MD while receiving chemo for breast CA      Social History: Social History   Social History  . Marital status: Married    Spouse name: N/A  . Number of children: N/A  . Years of education: N/A   Social History Main Topics  . Smoking status: Never Smoker  . Smokeless tobacco: Never Used  . Alcohol use No  . Drug use: No  . Sexual activity: Yes   Other Topics Concern  . None   Social History Narrative  . None    Family History: Family History  Problem Relation Age of Onset  . Diabetes Neg Hx   . Birth defects Neg Hx     Allergies: No Known Allergies  Prescriptions Prior to Admission  Medication Sig Dispense Refill Last Dose  . metoCLOPramide (REGLAN) 10  MG tablet Take 1 tablet (10 mg total) by mouth every 6 (six) hours. (Patient not taking: Reported on 04/26/2017) 30 tablet 0 Not Taking     Review of Systems   All systems reviewed and negative except as stated in HPI  Physical Exam Blood pressure 100/76, pulse 84, temperature 98.4 F (36.9 C), temperature source Oral, resp. rate 18, height 5' 3.39" (1.61 m), weight 210 lb (95.3 kg), last menstrual period 08/07/2016. General appearance: alert, cooperative, appears stated age and no distress Lungs: clear to auscultation bilaterally Heart: regular rate and rhythm Abdomen: soft, non-tender; bowel sounds normal Extremities: No calf swelling or tenderness Presentation: cephalic Fetal monitoring: 115 bpm, mod var, +accels, no decels Uterine activity: q73min Dilation: 1 Effacement (%): 60 Station: -3 Exam by:: Mathias Bogacki   Prenatal labs: ABO, Rh: --/--/B POS (06/18 1215) Antibody: PENDING (06/18 1215) Rubella: !Error! RPR: Non Reactive (03/20 1425)  HBsAg: NEGATIVE (11/20 1057)  HIV: Non Reactive (03/20 1425)  GBS:   POS 2 hr Glucola: 80/166/106 - normal Genetic screening:  declined Anatomy US: Normal, female, anterior placenta  Prenatal Transfer Tool  Maternal Diabetes: No Genetic Screening: Declined Maternal Ultrasounds/Referrals: Normal Fetal Ultrasounds or other Referrals:  None Maternal Substance Abuse:  No Significant Maternal  Medications:  None Significant Maternal Lab Results: Lab values include: Group B Strep positive  Results for orders placed or performed during the hospital encounter of 05/14/17 (from the past 24 hour(s))  CBC   Collection Time: 05/14/17 12:15 PM  Result Value Ref Range   WBC 6.4 4.0 - 10.5 K/uL   RBC 4.51 3.87 - 5.11 MIL/uL   Hemoglobin 13.1 12.0 - 15.0 g/dL   HCT 38.5 36.0 - 46.0 %   MCV 85.4 78.0 - 100.0 fL   MCH 29.0 26.0 - 34.0 pg   MCHC 34.0 30.0 - 36.0 g/dL   RDW 15.0 11.5 - 15.5 %   Platelets 157 150 - 400 K/uL  Type and screen    Collection Time: 05/14/17 12:15 PM  Result Value Ref Range   ABO/RH(D) B POS    Antibody Screen PENDING    Sample Expiration 05/17/2017     Patient Active Problem List   Diagnosis Date Noted  . Post-dates pregnancy 05/14/2017  . Group B streptococcal infection during pregnancy 05/01/2017  . AMA (advanced maternal age) multigravida 49+, first trimester 10/16/2016  . Supervision of other high risk pregnancy, antepartum 10/16/2016  . History of cancer of left breast 10/16/2016  . Language barrier affecting health care 10/16/2016  . History of C-section 10/16/2016    Assessment: Priscilla Lewis is a 36 y.o. P6L2493 at [redacted]w[redacted]d here for IOL.   #Labor: IOL with FB placement and pitocin;  TOLAC #Pain: Epidural on request #FWB: Cat I #ID:  GBS Pos - PCN #MOF: Bottle (maybe breast, h/o left mastectomy 2/2 breast cancer) #MOC:Interval BTL #Circ:  Outpatient  Katherine Basset, DO [place] for [facility], [facility] 05/14/2017, 1:19 PM

## 2017-05-14 NOTE — Anesthesia Preprocedure Evaluation (Signed)
Anesthesia Evaluation  Patient identified by MRN, date of birth, ID band Patient awake    Reviewed: Allergy & Precautions, H&P , NPO status , Patient's Chart, lab work & pertinent test results  Airway Mallampati: I  TM Distance: >3 FB Neck ROM: full    Dental no notable dental hx.    Pulmonary neg pulmonary ROS,    Pulmonary exam normal breath sounds clear to auscultation       Cardiovascular negative cardio ROS Normal cardiovascular exam Rhythm:regular Rate:Normal     Neuro/Psych negative neurological ROS  negative psych ROS   GI/Hepatic negative GI ROS, Neg liver ROS,   Endo/Other  negative endocrine ROS  Renal/GU negative Renal ROS  negative genitourinary   Musculoskeletal negative musculoskeletal ROS (+)   Abdominal (+) + obese,   Peds  Hematology negative hematology ROS (+)   Anesthesia Other Findings   Reproductive/Obstetrics (+) Pregnancy                             Anesthesia Physical Anesthesia Plan  ASA: II  Anesthesia Plan: Epidural   Post-op Pain Management:    Induction:   PONV Risk Score and Plan:   Airway Management Planned:   Additional Equipment:   Intra-op Plan:   Post-operative Plan:   Informed Consent: I have reviewed the patients History and Physical, chart, labs and discussed the procedure including the risks, benefits and alternatives for the proposed anesthesia with the patient or authorized representative who has indicated his/her understanding and acceptance.     Plan Discussed with:   Anesthesia Plan Comments:         Anesthesia Quick Evaluation

## 2017-05-14 NOTE — Progress Notes (Signed)
Patient ID: Priscilla Lewis, female   DOB: Apr 08, 1981, 36 y.o.   MRN: [id number]  S: Patient seen & examined for repetitive prolonged decels with contractions. Patient comfortable with epidural.    O:  Vitals:   05/14/17 1730 05/14/17 1800 05/14/17 1811 05/14/17 1830  BP: 110/67 (!) 91/50 (!) 90/52 (!) 100/48  Pulse: 71 71 76 (!) 132  Resp: 18     Temp:      TempSrc:      Weight:      Height:        Dilation: 6 Effacement (%): 80 Cervical Position: Posterior Station: -2 Presentation: Vertex Exam by:: Kennon Rounds (Dr Kennon Rounds reduced cord behind head)  The umbilical cord could be felt in the bag of fluid laying over the head of the baby. Dr. Kennon Rounds was called to assess, she was able to reduce the cord behind the head. Amniotomy was NOT performed. Pitocin was turned off.  Prior to reduction of umbilical cord- FHT: 967 bpm, mod var, no accels, recurrent variable decelerations with each contraction with return to baseline then prolonged deceleration into 60s for 5 minutes before reduction of cord.   Now after reduction of umbilical cord and pitocin off-  FHT: 115bpm, mod var, +accels, no decels TOCO: q11min   A/P: Keep pitocin off for about 30-45 min, if reactive OK to restart pitocin at 4 millunits Spoke with patient about possibility of cesarean section with interpretor, consent signed, in case of emergent cesarean Continue expectant management Anticipate SVD

## 2017-05-14 NOTE — Progress Notes (Signed)
Pt reports decreased FM since yesterday.

## 2017-05-15 ENCOUNTER — Encounter (HOSPITAL_COMMUNITY): Payer: Self-pay

## 2017-05-15 DIAGNOSIS — O99824 Streptococcus B carrier state complicating childbirth: Secondary | ICD-10-CM

## 2017-05-15 DIAGNOSIS — Z3A40 40 weeks gestation of pregnancy: Secondary | ICD-10-CM

## 2017-05-15 LAB — RPR: RPR Ser Ql: NONREACTIVE

## 2017-05-15 MED ORDER — ACETAMINOPHEN 325 MG PO TABS
650.0000 mg | ORAL_TABLET | ORAL | Status: DC | PRN
  Administered 2017-05-15 – 2017-05-16 (×2): 650 mg via ORAL
  Filled 2017-05-15 (×2): qty 2

## 2017-05-15 MED ORDER — SODIUM CHLORIDE 0.9% FLUSH: 3.0000 mL | Freq: Two times a day (BID) | INTRAVENOUS | Status: DC

## 2017-05-15 MED ORDER — OXYTOCIN 40 UNITS IN LACTATED RINGERS INFUSION - SIMPLE MED: 2.5000 [IU]/h | INTRAVENOUS | Status: DC | PRN

## 2017-05-15 MED ORDER — ZOLPIDEM TARTRATE 5 MG PO TABS
5.0000 mg | ORAL_TABLET | Freq: Every evening | ORAL | Status: DC | PRN
Start: 2017-05-15 — End: 2017-05-17

## 2017-05-15 MED ORDER — SENNOSIDES-DOCUSATE SODIUM 8.6-50 MG PO TABS
2.0000 | ORAL_TABLET | ORAL | Status: DC
  Administered 2017-05-16 (×2): 2 via ORAL
  Filled 2017-05-15 (×2): qty 2

## 2017-05-15 MED ORDER — SODIUM CHLORIDE 0.9 % IV SOLN: 250.0000 mL | INTRAVENOUS | Status: DC | PRN

## 2017-05-15 MED ORDER — COCONUT OIL OIL
1.0000 "application " | TOPICAL_OIL | Status: DC | PRN
  Administered 2017-05-17: 1 via TOPICAL
  Filled 2017-05-15: qty 120

## 2017-05-15 MED ORDER — SIMETHICONE 80 MG PO CHEW: 80.0000 mg | CHEWABLE_TABLET | ORAL | Status: DC | PRN

## 2017-05-15 MED ORDER — SODIUM CHLORIDE 0.9% FLUSH: 3.0000 mL | INTRAVENOUS | Status: DC | PRN

## 2017-05-15 MED ORDER — DIPHENHYDRAMINE HCL 25 MG PO CAPS: 25.0000 mg | ORAL_CAPSULE | Freq: Four times a day (QID) | ORAL | Status: DC | PRN

## 2017-05-15 MED ORDER — METHYLERGONOVINE MALEATE 0.2 MG/ML IJ SOLN
0.2000 mg | Freq: Once | INTRAMUSCULAR | Status: AC
  Administered 2017-05-15: 0.2 mg via INTRAMUSCULAR

## 2017-05-15 MED ORDER — WITCH HAZEL-GLYCERIN EX PADS
1.0000 | MEDICATED_PAD | CUTANEOUS | Status: DC | PRN
Start: 2017-05-15 — End: 2017-05-17

## 2017-05-15 MED ORDER — ONDANSETRON HCL 4 MG/2ML IJ SOLN: 4.0000 mg | INTRAMUSCULAR | Status: DC | PRN

## 2017-05-15 MED ORDER — BENZOCAINE-MENTHOL 20-0.5 % EX AERO
1.0000 "application " | INHALATION_SPRAY | CUTANEOUS | Status: DC | PRN
  Administered 2017-05-15: 1 via TOPICAL
  Filled 2017-05-15: qty 56

## 2017-05-15 MED ORDER — ONDANSETRON HCL 4 MG PO TABS: 4.0000 mg | ORAL_TABLET | ORAL | Status: DC | PRN

## 2017-05-15 MED ORDER — DIBUCAINE 1 % RE OINT: 1.0000 "application " | TOPICAL_OINTMENT | RECTAL | Status: DC | PRN

## 2017-05-15 MED ORDER — IBUPROFEN 600 MG PO TABS
600.0000 mg | ORAL_TABLET | Freq: Four times a day (QID) | ORAL | Status: DC
  Administered 2017-05-15 – 2017-05-17 (×10): 600 mg via ORAL
  Filled 2017-05-15 (×10): qty 1

## 2017-05-15 MED ORDER — PRENATAL MULTIVITAMIN CH
1.0000 | ORAL_TABLET | Freq: Every day | ORAL | Status: DC
  Administered 2017-05-15 – 2017-05-16 (×2): 1 via ORAL
  Filled 2017-05-15 (×2): qty 1

## 2017-05-15 MED ORDER — OXYCODONE HCL 5 MG PO TABS
5.0000 mg | ORAL_TABLET | ORAL | Status: DC | PRN
  Administered 2017-05-15: 5 mg via ORAL
  Filled 2017-05-15: qty 1

## 2017-05-15 MED ORDER — TETANUS-DIPHTH-ACELL PERTUSSIS 5-2.5-18.5 LF-MCG/0.5 IM SUSP: 0.5000 mL | Freq: Once | INTRAMUSCULAR | Status: DC

## 2017-05-15 NOTE — Progress Notes (Signed)
Patient ID: Priscilla Lewis, female   DOB: 1981/10/27, 36 y.o.   MRN: [id number]  S: Patient seen & examined for progress of labor. Patient comfortable with epidural. Patient feels rectal pressure.    O:  Vitals:   05/14/17 2131 05/14/17 2200 05/14/17 2232 05/14/17 2301  BP: 107/69 98/66 104/65 102/85  Pulse: (!) 58 67 65 79  Resp: 16 16 16 16   Temp:      TempSrc:      Weight:      Height:        Dilation: 10 Dilation Complete Date: 05/15/17 Dilation Complete Time: 0001 Effacement (%): 100 Cervical Position: Posterior Station: +1 Presentation: Vertex Exam by:: Dr. Vanetta Shawl  Attempted pushing with patient, however no pelvic floor engagement. Epidural ran out and will keep off for now until more feeling returns. Will continue pushing with patient.  FHT: 110bpm, mod var, +accels, a couple variable decels with contractions that resolve to baseline TOCO: q2-91min   A/P: Continue expectant management Anticipate SVD

## 2017-05-15 NOTE — Progress Notes (Signed)
Arabic phone interpreter used to explain all of the plan of care and to answer all questions. Infant was spitty and explained to mom via interpreter how to use bulb suction and to watch for blue color changes and emergency bell explained.

## 2017-05-15 NOTE — Anesthesia Postprocedure Evaluation (Signed)
Anesthesia Post Note  Patient: Kym Tibbett  Procedure(s) Performed: * No procedures listed *     Patient location during evaluation: Mother Baby Anesthesia Type: Epidural Level of consciousness: awake and alert and oriented Pain management: satisfactory to patient Vital Signs Assessment: post-procedure vital signs reviewed and stable Respiratory status: spontaneous breathing and nonlabored ventilation Cardiovascular status: stable Postop Assessment: no headache, no backache, no signs of nausea or vomiting, adequate PO intake and patient able to bend at knees (patient up walking) Anesthetic complications: no    Last Vitals:  Vitals:   05/15/17 0400 05/15/17 0537  BP: (!) 116/55 (!) 104/56  Pulse: 71 71  Resp: 18 18  Temp: 36.9 C 36.8 C    Last Pain:  Vitals:   05/15/17 0537  TempSrc: Oral  PainSc: 0-No pain   Pain Goal:                 Leba Tibbitts

## 2017-05-15 NOTE — Lactation Note (Signed)
This note was copied from a baby's chart. Lactation Consultation Note: Mother speaks Arabic. She understands limited [place]. She can speak some [place]. 38 year old daughter at the bedside and interprets very well. Mother reports that she breastfed 3 other children for one year each.Mother had a Lt Mastectomy in 2010. She also had some reconstructive surgery to Left breast.  She had some breast tissue removed from Rt breast for implant to Left.  Mother was given Lactation brochure and informed of all [facility] services.  Mother complaints that latch is hurting. Observed that nipple is pinched. Assist mother with cross cradle hold. Infant latched on with better depth. Observed good flow of colostrum. Mother encouraged to cue base feed and limit formula. Reviewed supply and demand . Mother concerned if she will make enough milk from just one breast. Lots of support given. Mother receptive to all teaching.   Patient Name: Priscilla Lewis [id number] Date: 05/15/2017 Reason for consult: Initial assessment   Maternal Data Has patient been taught Hand Expression?: Yes Does the patient have breastfeeding experience prior to this delivery?: Yes  Feeding Feeding Type: Breast Fed Length of feed: 10 min  LATCH Score/Interventions Latch: Grasps breast easily, tongue down, lips flanged, rhythmical sucking.  Audible Swallowing: A few with stimulation Intervention(s): Hand expression  Type of Nipple: Everted at rest and after stimulation  Comfort (Breast/Nipple): Soft / non-tender     Hold (Positioning): Assistance needed to correctly position infant at breast and maintain latch. Intervention(s): Support Pillows;Position options  LATCH Score: 8  Lactation Tools Discussed/Used     Consult Status Consult Status: Follow-up Date: 05/15/17 Follow-up type: In-patient    Jess Barters [facility] 05/15/2017, 2:53 PM

## 2017-05-16 NOTE — Lactation Note (Signed)
This note was copied from a baby's chart. Lactation Consultation Note Arabic speaking mom, understands limited [place]. Used [employer] Education officer, community [phone], got disconnected after 5 min. #[phone] finished interpretering consult. Mom has 38 yr old, 45 yr old and 69 yr old that she BF for 1 yr each. Denied difficulty bf. Mom has a mastectomy in 2010 of Lt. Breast. Rt. Nipple has been removed and replaced. Some of nipple tissue placed on Lt. Breast after silicone implants 3 yrs ago per mom. Hand expression w/colostrum noted. Encouraged to hand express colostrum for supplement. Set up DEBP for stimulation to increase milk supply. Mom shown how to use DEBP & how to disassemble, clean, & reassemble parts. Mom knows to pump q3h for 15-20 min. Mom pumped w/1.5 ml colostrum. Hand expressed .5 ml colostrum. Baby started crying so mom wanted baby to be fed supplement. Gave colostrum first, then formula. Supplement feeding sheet given for hours of age. Mom teach back. Mom knows not to mix colostrum w/formula, and to give colostrum 1st.  Gave 10 ml formula. Cleaned parts w/demonstration.  Baby's nose is very congested. RN had given saline nose drops earlier. Demonstrated football hold for baby to BF and breath easier.  Stressed importance of STS, no blankets during BF. Baby had outfit and 2 blankets on while BF when LC entered rm.  Mom hadn't been writing down I&O. Stressed importance of I&O, demonstrated filling I&O sheet out w/interpreter. Mom stated she could understand the numbers for time and wet/stool. LC drew water drops for wets.  Mom sleepy. Discussed cluster feeding. Encouraged mom to sleep while baby sleeps, wake baby if hasn't cued to BF in 3 hrs. Mom does need someone to interpret for her to understand when teaching. Mom stated she didn't have any further questions before interpreter disconnected. Encouraged to call if needs assistance or questions.  Patient Name: Priscilla Lewis [id number] Date: 05/16/2017 Reason for consult: Follow-up assessment   Maternal Data    Feeding Feeding Type: Breast Milk with Formula added Nipple Type: Slow - flow Length of feed: 10 min  LATCH Score/Interventions Latch: Grasps breast easily, tongue down, lips flanged, rhythmical sucking. Intervention(s): Adjust position;Assist with latch;Breast massage;Breast compression  Audible Swallowing: A few with stimulation Intervention(s): Skin to skin;Hand expression;Alternate breast massage  Type of Nipple: Everted at rest and after stimulation  Comfort (Breast/Nipple): Soft / non-tender     Hold (Positioning): Assistance needed to correctly position infant at breast and maintain latch. Intervention(s): Breastfeeding basics reviewed;Support Pillows;Position options;Skin to skin  LATCH Score: 8  Lactation Tools Discussed/Used Tools: Pump Breast pump type: Double-Electric Breast Pump Pump Review: Setup, frequency, and cleaning;Milk Storage Initiated by:: Allayne Stack RN IBCLC Date initiated:: 05/16/17   Consult Status Consult Status: Follow-up Date: 05/16/17 Follow-up type: In-patient    Priscilla Lewis, Priscilla Lewis 05/16/2017, 3:21 AM

## 2017-05-16 NOTE — Lactation Note (Signed)
This note was copied from a baby's chart. Lactation Consultation Note  Patient Name: Priscilla Lewis FRTMY'T Date: 05/16/2017 Reason for consult: Follow-up assessment   With this mom of a term baby now 51 hours old. Mom is breast feeding on the rignt breast, and then supplementing with formula. I used tablet live interpreter, Achman [phone]. I reviewed with mom that as her milk transitions in, she should be able to fully breast feed with one breast. I told her the baby will probably want to cluster feed now, since his stomach has been stretched from formula. I gave mom enough formula for 2 days, to take home, if discharged today, and explained storage guidelines.Mom does not want an o/p consult, but knows to call for questions/coners. <Mom's children speak english.    Maternal Data    Feeding Feeding Type: Breast Fed Length of feed:  (lataached when I left the room)  LATCH Score/Interventions Latch: Grasps breast easily, tongue down, lips flanged, rhythmical sucking. Intervention(s): Adjust position  Audible Swallowing: None Intervention(s): Hand expression  Type of Nipple: Everted at rest and after stimulation  Comfort (Breast/Nipple): Soft / non-tender     Hold (Positioning): No assistance needed to correctly position infant at breast.  LATCH Score: 8  Lactation Tools Discussed/Used     Consult Status Consult Status: Complete Follow-up type: Call as needed    Tonna Corner 05/16/2017, 10:40 AM

## 2017-05-16 NOTE — Progress Notes (Signed)
Post Partum Day 1 Subjective: Eating, drinking, voiding, ambulating well.  +flatus.  Lochia and pain wnl.  Denies dizziness, lightheadedness, or sob. No complaints. Does not want to go home today  Objective: Blood pressure 115/73, pulse 70, temperature 98.1 F (36.7 C), temperature source Oral, resp. rate 17, height 5' 3.39" (1.61 m), weight 95.3 kg (210 lb), last menstrual period 08/07/2016, SpO2 98 %, unknown if currently breastfeeding.  Physical Exam:  General: alert, cooperative and no distress Lochia: appropriate Uterine Fundus: firm Incision: n/a DVT Evaluation: No evidence of DVT seen on physical exam. Negative Homan's sign. No cords or calf tenderness. No significant calf/ankle edema.   Recent Labs  05/14/17 1215  HGB 13.1  HCT 38.5    Assessment/Plan: Plan for discharge tomorrow  Breast & bottlefeeding Plans interval BTL   LOS: 2 days   Tawnya Crook 05/16/2017, 7:11 AM

## 2017-05-17 ENCOUNTER — Encounter: Payer: Self-pay | Admitting: *Deleted

## 2017-05-17 ENCOUNTER — Other Ambulatory Visit: Payer: Medicaid Other | Admitting: Advanced Practice Midwife

## 2017-05-17 MED ORDER — PRENATAL MULTIVITAMIN CH: 1.0000 | ORAL_TABLET | Freq: Every day | ORAL | 0 refills | Status: AC

## 2017-05-17 MED ORDER — SENNOSIDES-DOCUSATE SODIUM 8.6-50 MG PO TABS: 2.0000 | ORAL_TABLET | Freq: Every evening | ORAL | 0 refills | Status: AC | PRN

## 2017-05-17 MED ORDER — IBUPROFEN 600 MG PO TABS: 600.0000 mg | ORAL_TABLET | Freq: Four times a day (QID) | ORAL | 0 refills | Status: AC

## 2017-05-17 NOTE — Progress Notes (Signed)
Live interpreter used for all dc teaching and to answer all questions. Mom told to make baby follow up for tomorrow before she leaves.

## 2017-05-17 NOTE — Lactation Note (Signed)
This note was copied from a baby's chart. Lactation Consultation Note  Patient Name: Priscilla Lewis [id number]'B Date: 05/17/2017 Reason for consult: Follow-up assessment    with this mom of a term baby, now 24 hours old. Mom's milk is beginning to transition in, and she was able to pump about 4 ml's, which she bottle fed to the baby, and then supplemented with formula, until she has enough EBm to supplemented with , or baby is satisfied with BF.  The baby has a short posterior frenulum, and his tongue is cupped with elevation. I explained to mom this makes it more difficult for him to stay latched deeply. I showed mom how to obtain a deep latch, to keep his nose touching, and how much of her areola should be in his mouth. I gave mom oral restriction resources to inform her about this subject, which she seemed to appreciate. Mom knows to call for lactation, once home, as needed. Mom's daughter interpreted for mom at this consult.   Maternal Data    Feeding Feeding Type: Breast Milk Nipple Type: Slow - flow Length of feed: 15 min  LATCH Score/Interventions Latch: Grasps breast easily, tongue down, lips flanged, rhythmical sucking. Intervention(s): Assist with latch  Audible Swallowing: A few with stimulation  Type of Nipple: Everted at rest and after stimulation  Comfort (Breast/Nipple): Filling, red/small blisters or bruises, mild/mod discomfort  Problem noted: Filling;Mild/Moderate discomfort (red, tender nipples)  Hold (Positioning): Assistance needed to correctly position infant at breast and maintain latch.  LATCH Score: 7  Lactation Tools Discussed/Used     Consult Status Consult Status: Complete Follow-up type: Call as needed    Tonna Corner 05/17/2017, 11:14 AM

## 2017-05-17 NOTE — Discharge Instructions (Signed)

## 2017-05-17 NOTE — Discharge Summary (Signed)
OB Discharge Summary     Patient Name: Priscilla Lewis DOB: 21-Oct-1981 MRN: [id number]  Date of admission: 05/14/2017 Delivering MD: Isaias Sakai   Date of discharge: 05/17/2017  Admitting diagnosis: 40wks failed nst no fetal movement Intrauterine pregnancy: [redacted]w[redacted]d     Secondary diagnosis:  Active Problems:   Post-dates pregnancy  Additional problems:  Patient Active Problem List   Diagnosis Date Noted  . Post-dates pregnancy 05/14/2017  . Group B streptococcal infection during pregnancy 05/01/2017  . AMA (advanced maternal age) multigravida 52+, first trimester 10/16/2016  . Supervision of other high risk pregnancy, antepartum 10/16/2016  . History of cancer of left breast 10/16/2016  . Language barrier affecting health care 10/16/2016  . History of C-section 10/16/2016        Discharge diagnosis: Term Pregnancy Delivered                                              Augmentation: AROM, Pitocin and Foley Balloon  Complications: None  Hospital course:  Onset of Labor With Vaginal Delivery     36 y.o. yo [id number] at [redacted]w[redacted]d was admitted in labor on 05/14/2017. Patient had an uncomplicated labor course as follows:  Membrane Rupture Time/Date: 12:01 AM ,05/15/2017   Intrapartum Procedures: Episiotomy: None [1]                                         Lacerations:  None [1]      Vacuum-assisted vaginal delivery Patient had a delivery of a Viable infant. 05/15/2017  Information for the patient's newborn:  Priscilla, Lewis [[id number]]  Delivery Method: VBAC, Vacuum Assisted (Filed from Delivery Summary)    Pateint had an uncomplicated postpartum course.  She is ambulating, tolerating a regular diet, passing flatus, and urinating well. Patient is discharged home in stable condition on 05/17/17.   Physical exam  Vitals:   05/15/17 0930 05/16/17 0533 05/16/17 1817 05/17/17 0545  BP: 111/68 115/73 104/67 (!) 106/53  Pulse: 80 70 86 70  Resp: 18 17 18 18   Temp: 98.2 F  (36.8 C) 98.1 F (36.7 C) 97.4 F (36.3 C) 98.4 F (36.9 C)  TempSrc: Oral Oral Oral Oral  SpO2:   99%   Weight:      Height:       General: alert, cooperative and no distress Lochia: appropriate Uterine Fundus: firm DVT Evaluation: No evidence of DVT seen on physical exam. Labs: Lab Results  Component Value Date   WBC 6.4 05/14/2017   HGB 13.1 05/14/2017   HCT 38.5 05/14/2017   MCV 85.4 05/14/2017   PLT 157 05/14/2017   No flowsheet data found.  Discharge instruction: per After Visit Summary and "Baby and Me Booklet".  After visit meds:  Allergies as of 05/17/2017   No Known Allergies     Medication List    STOP taking these medications   metoCLOPramide 10 MG tablet Commonly known as:  REGLAN     TAKE these medications   ibuprofen 600 MG tablet Commonly known as:  ADVIL,MOTRIN Take 1 tablet (600 mg total) by mouth every 6 (six) hours.   prenatal multivitamin Tabs tablet Take 1 tablet by mouth daily at 12 noon.   senna-docusate 8.6-50 MG tablet Commonly known as:  Senokot-S Take 2 tablets by mouth at bedtime as needed for mild constipation.       Diet: routine diet  Activity: Advance as tolerated. Pelvic rest for 6 weeks.   Outpatient follow up:6 weeks Follow up Appt: Future Appointments Date Time Provider [place]  06/12/2017 2:20 PM Aletha Halim, MD [facility]   Follow up Visit:No Follow-up on file.  Postpartum contraception: Tubal Ligation , interval Newborn Data: Live born female  Birth Weight: 8 lb 6.2 oz (3805 g) APGAR: 8, 9  Baby Feeding: Breast Disposition:home with mother   05/17/2017 Everrett Coombe, MD   I confirm that I have verified the information documented in the resident's note and that I have also personally reperformed the physical exam and all medical decision making activities.  Maye Hides CNM

## 2017-05-17 NOTE — Lactation Note (Signed)
This note was copied from a baby's chart. Lactation Consultation Note  Patient Name: Priscilla Lewis Date: 05/17/2017 Reason for consult: Follow-up assessment  RN requested that I come in, as Mom was complaining about her R breast being warm. Mom's milk is coming/has come to volume. She is not engorged. She was able to pump about 79mL from her R breast in 5 minutes. She then gave that to the baby via bottle.   How to wash pump parts reviewed. Mom has no complaints about her L breast.   Matthias Hughs [facility] 05/17/2017, 1:37 PM

## 2017-05-20 ENCOUNTER — Inpatient Hospital Stay (HOSPITAL_COMMUNITY): Admission: RE | Admit: 2017-05-20 | Payer: Medicaid Other | Source: Ambulatory Visit

## 2017-05-24 NOTE — Progress Notes (Signed)
Non reactive NST. Will d/w on call for IOL

## 2017-06-12 ENCOUNTER — Ambulatory Visit: Payer: Medicaid Other | Admitting: Obstetrics and Gynecology

## 2017-06-13 ENCOUNTER — Encounter: Payer: Self-pay | Admitting: Obstetrics and Gynecology

## 2017-06-13 NOTE — Progress Notes (Signed)
Patient did not keep postpartum appointment for 06/13/2017.  Durene Romans MD Attending [facility] for [employer] farm [employer])

## 2017-06-15 ENCOUNTER — Encounter: Payer: Self-pay | Admitting: General Practice

## 2017-07-11 ENCOUNTER — Ambulatory Visit: Payer: Medicaid Other | Admitting: Obstetrics and Gynecology

## 2017-07-11 ENCOUNTER — Encounter: Payer: Self-pay | Admitting: Obstetrics and Gynecology

## 2017-07-11 NOTE — Progress Notes (Signed)
Patient did not keep postpartum appointment for 07/11/2017.  Durene Romans MD Attending Center for [employer] farm manager)

## 2018-03-22 IMAGING — US US MFM OB DETAIL+14 WK
1 series · 13 of 28 positions shown · non-contrast
Comparison: none

[Series 1: us mfm ob detail+14 wk · 82 acquisitions, 13 frames shown]
[im 4/82]
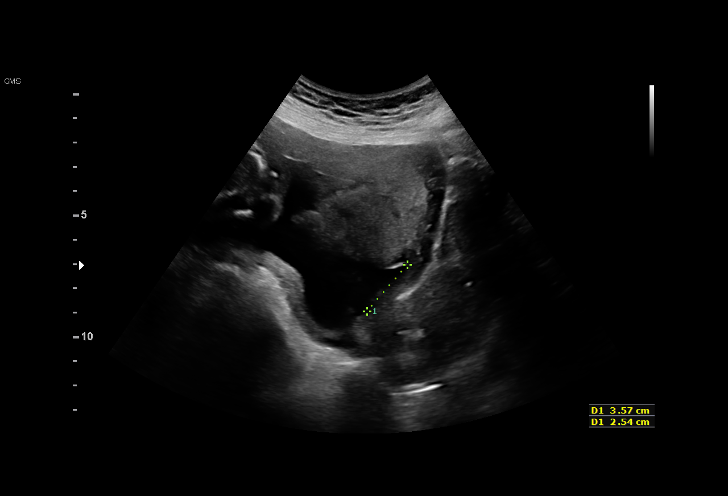
[im 10/82]
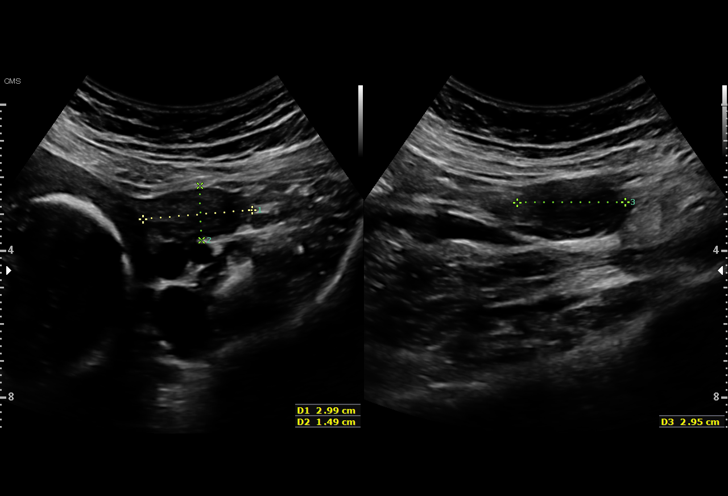
[im 16/82]
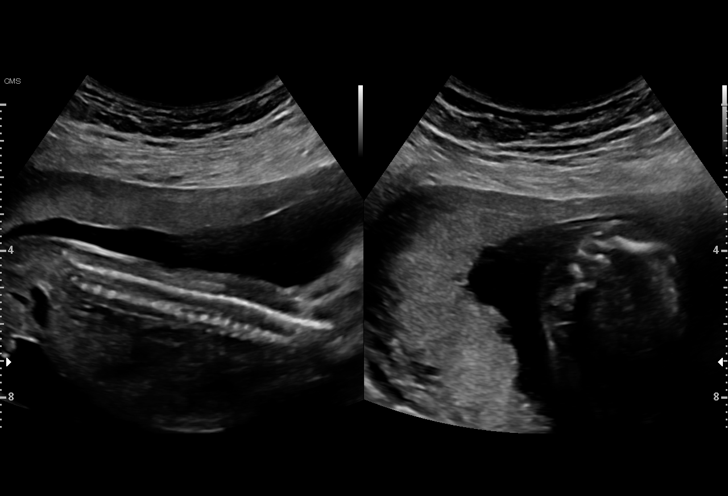
[im 22/82]
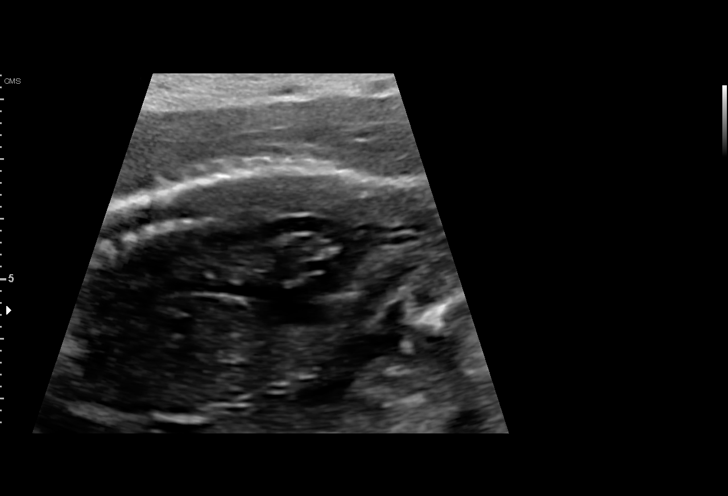
[im 28/82]
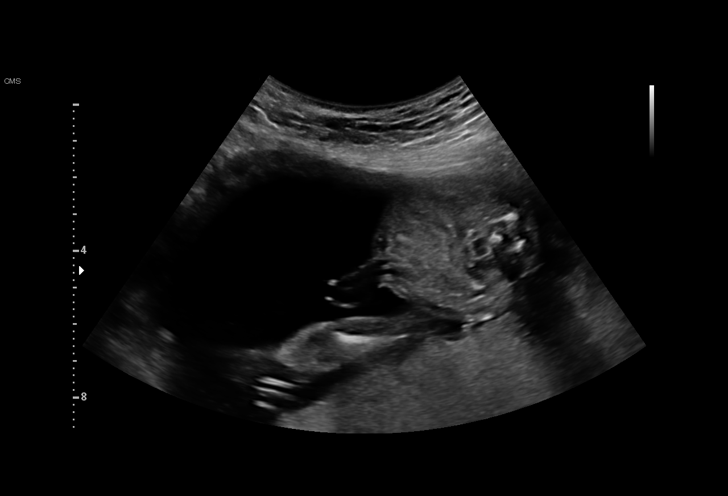
[im 34/82]
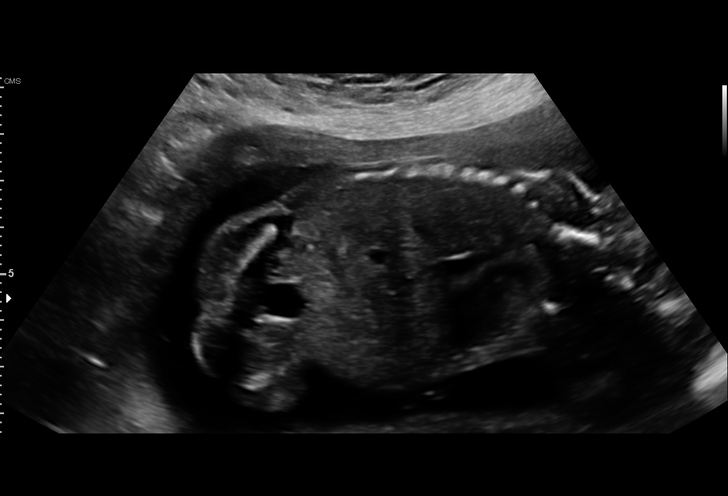
[im 43/82]
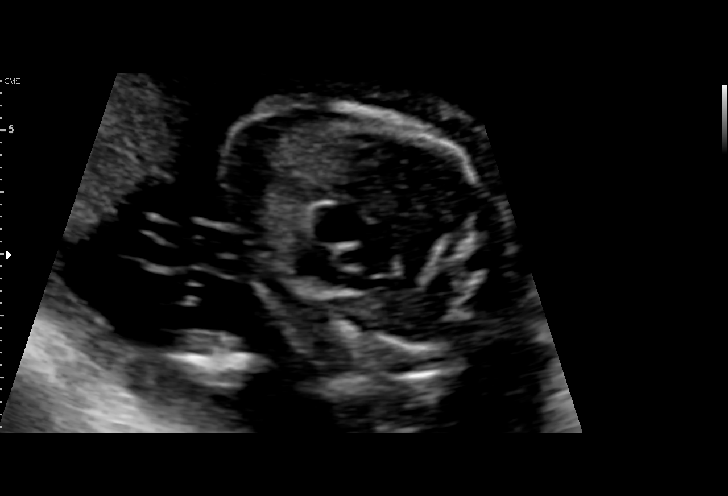
[im 49/82]
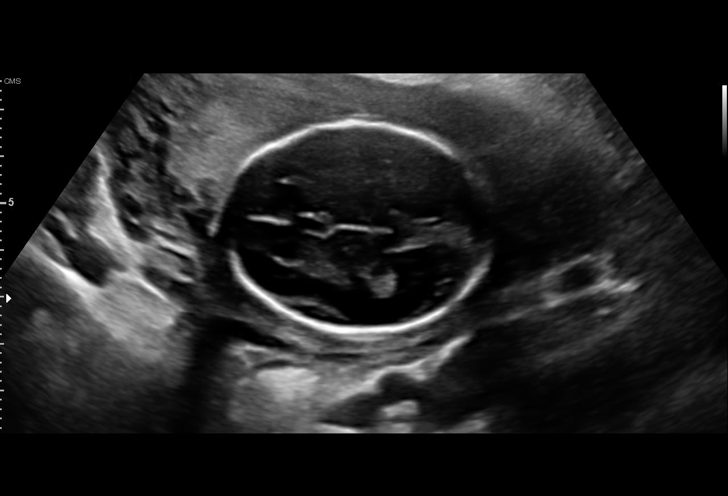
[im 55/82]
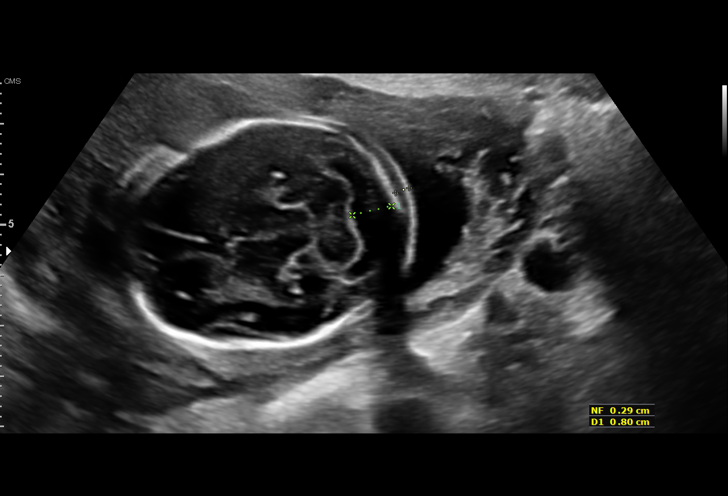
[im 61/82]
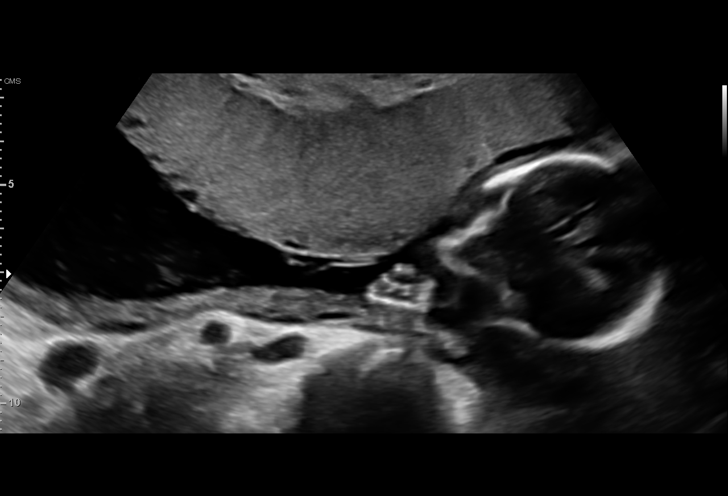
[im 67/82]
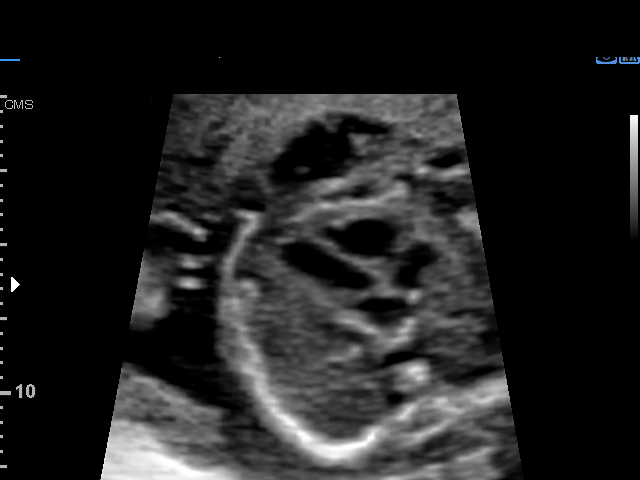
[im 73/82]
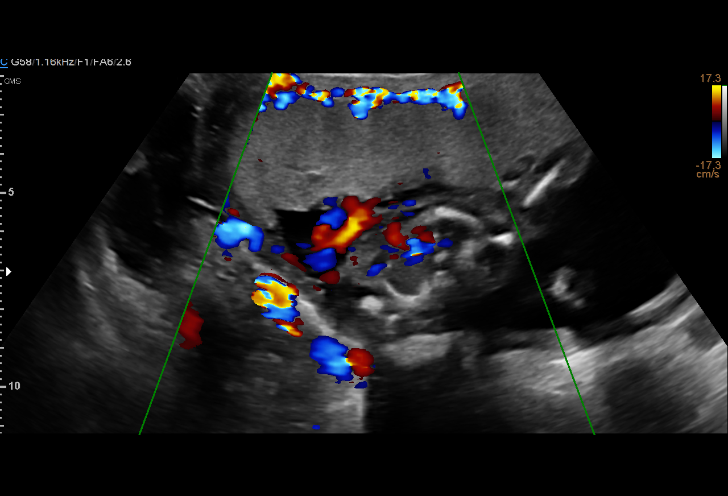
[im 79/82]
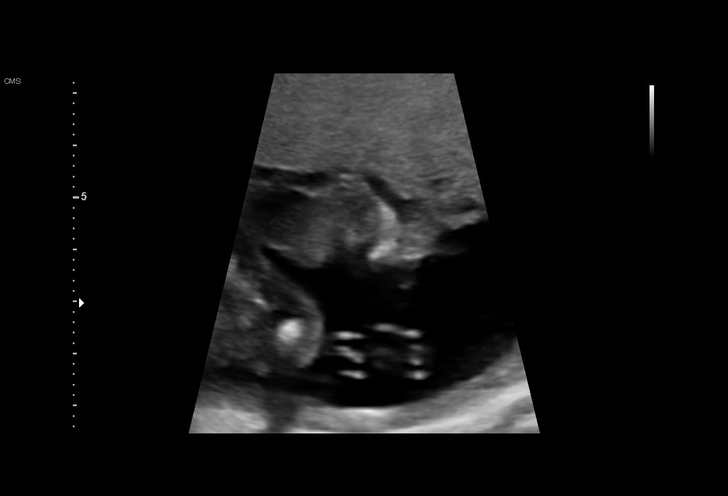

[13 of 28 positions shown; findings below may reference images not displayed]

Rojales DO

1  LORRAINE JIM          [id number]      [id number]     [id number]
Indications

19 weeks gestation of pregnancy
Encounter for fetal anatomic survey
Advanced maternal age multigravida 35+,
second trimester (declined testing)
Previous cesarean delivery, antepartum
OB History

Gravidity:    5         Term:   3        Prem:   0        SAB:   0
TOP:          1       Ectopic:  0        Living: 3
Fetal Evaluation

Num Of Fetuses:     1
Fetal Heart         149
Rate(bpm):
Cardiac Activity:   Observed
Presentation:       Cephalic
Placenta:           Anterior, above cervical os
P. Cord Insertion:  Visualized

Amniotic Fluid
AFI FV:      Subjectively within normal limits

Largest Pocket(cm)
6.44
Biometry

BPD:      44.5  mm     G. Age:  19w 3d         64  %    CI:        69.63   %    70 - 86
FL/HC:      17.3   %    16.1 -
HC:      170.2  mm     G. Age:  19w 4d         66  %    HC/AC:      1.08        1.09 -
AC:      157.1  mm     G. Age:  20w 6d         91  %    FL/BPD:     66.1   %
FL:       29.4  mm     G. Age:  19w 0d         40  %    FL/AC:      18.7   %    20 - 24
HUM:      28.6  mm     G. Age:  19w 2d         54  %
CER:      20.9  mm     G. Age:  19w 6d         66  %
NFT:       3.6  mm

CM:        5.8  mm

Est. FW:     324  gm    0 lb 11 oz      58  %
Gestational Age

LMP:           19w 1d        Date:  08/07/16                 EDD:   05/14/17
U/S Today:     19w 5d                                        EDD:   05/10/17
Best:          19w 1d     Det. By:  LMP  (08/07/16)          EDD:   05/14/17
Anatomy

Cranium:               Appears normal         Aortic Arch:            Appears normal
Cavum:                 Appears normal         Ductal Arch:            Appears normal
Ventricles:            Appears normal         Diaphragm:              Appears normal
Choroid Plexus:        Appears normal         Stomach:                Appears normal, left
sided
Cerebellum:            Appears normal         Abdomen:                Appears normal
Posterior Fossa:       Appears normal         Abdominal Wall:         Appears nml (cord
insert, abd wall)
Nuchal Fold:           Appears normal         Cord Vessels:           Appears normal (3
vessel cord)
Face:                  Appears normal         Kidneys:                Appear normal
(orbits and profile)
Lips:                  Appears normal         Bladder:                Appears normal
Thoracic:              Appears normal         Spine:                  Appears normal
Heart:                 Appears normal         Upper Extremities:      Appears normal
(4CH, axis, and
situs)
RVOT:                  Appears normal         Lower Extremities:      Appears normal
LVOT:                  Appears normal

Other:  Fetus appears to be a male. Heels and 5th digit visualized. Open
hands visualized. Nasal bone visualized.
Cervix Uterus Adnexa

Cervix
Length:            3.5  cm.
Normal appearance by transabdominal scan.

Uterus
No abnormality visualized.

Left Ovary
Within normal limits.

Right Ovary
Within normal limits.

Cul De Sac:   No free fluid seen.

Adnexa:       No abnormality visualized.
Impression

Singleton intrauterine pregnancy at 19+1 weeks. AMA,
declined counseling and testiong
Review of the anatomy shows no sonographic markers for
aneuploidy or structural anomalies
Amniotic fluid volume is normal
Placenta is anterior with no signs of abnormal placentation
Estimated fetal weight is 324g which is growth in the 58th
percentile
Recommendations

Normal growth and development. Follow-up ultrasounds as
clinically indicated.

## 2018-08-15 IMAGING — US US MFM FETAL BPP W/O NON-STRESS
1 series · 14 of 14 positions shown · non-contrast
Comparison: none

[Series 1: us mfm fetal bpp w/o non-stress · 14 acquisitions, 14 frames shown]
[im 1/14]
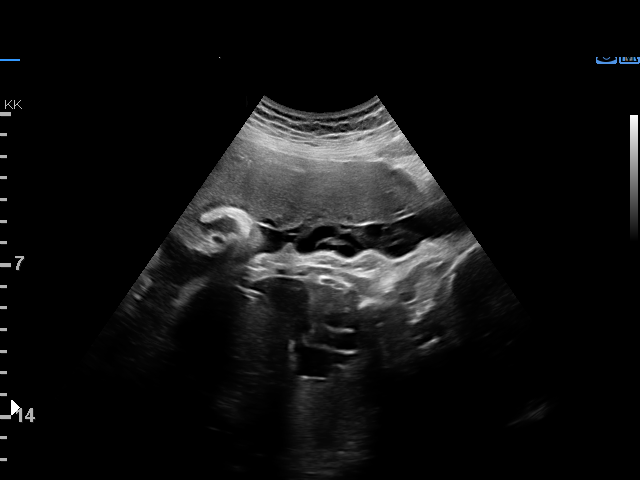
[im 2/14]
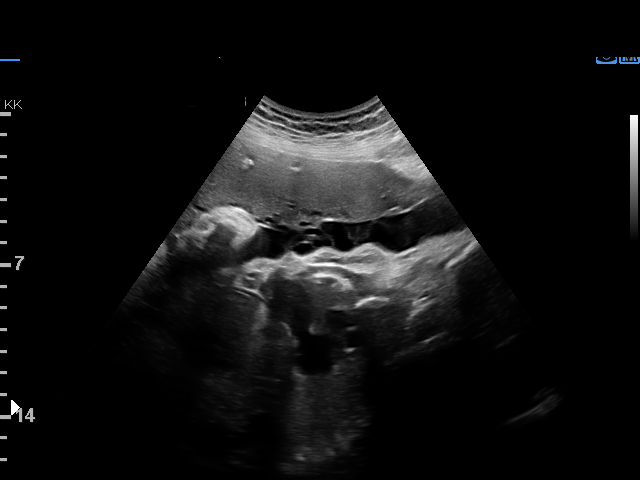
[im 3/14]
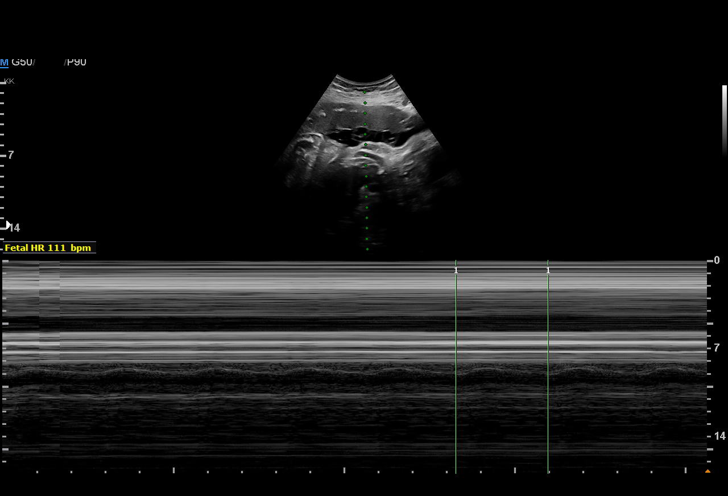
[im 4/14]
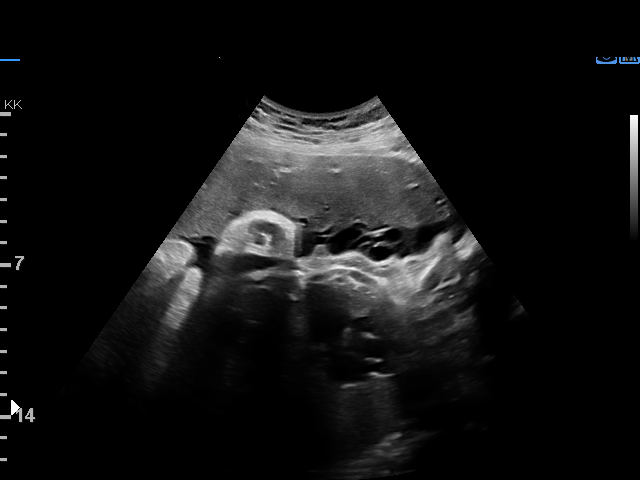
[im 5/14]
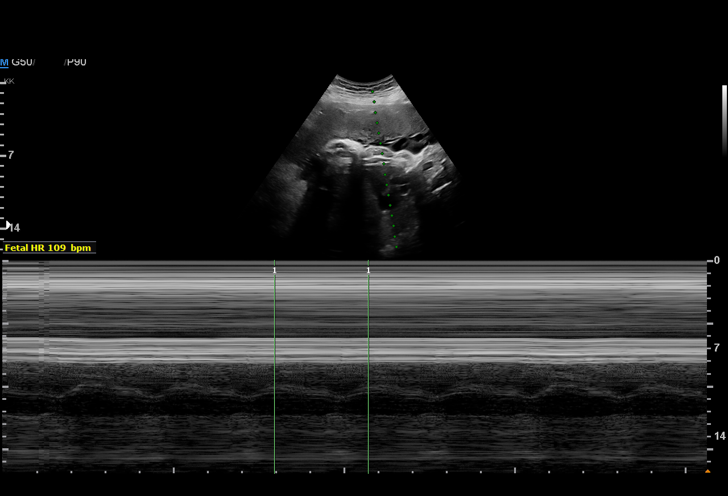
[im 6/14]
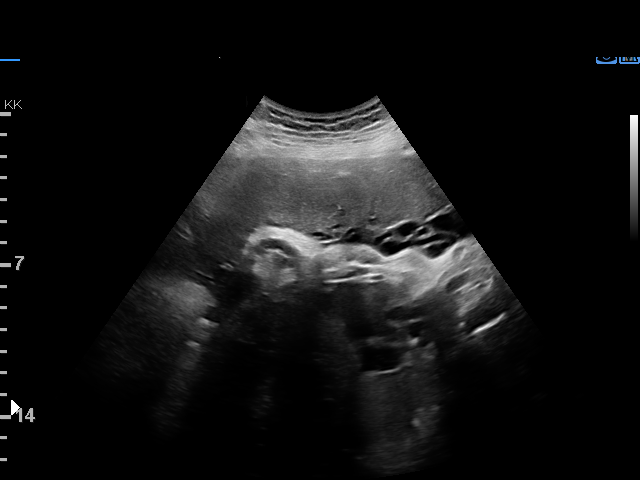
[im 7/14]
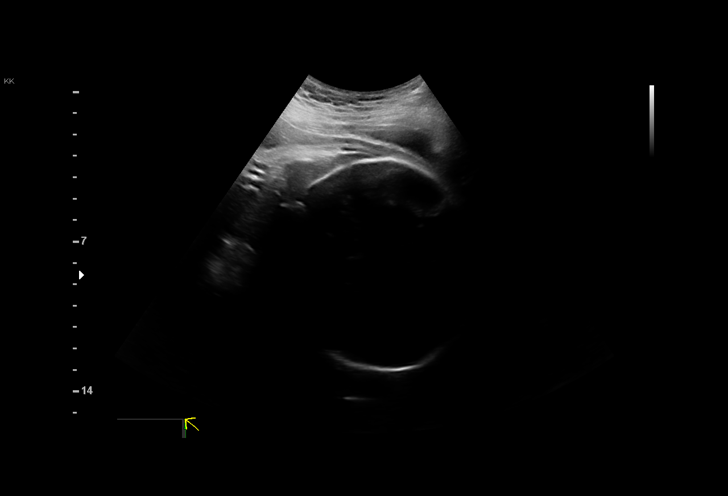
[im 8/14]
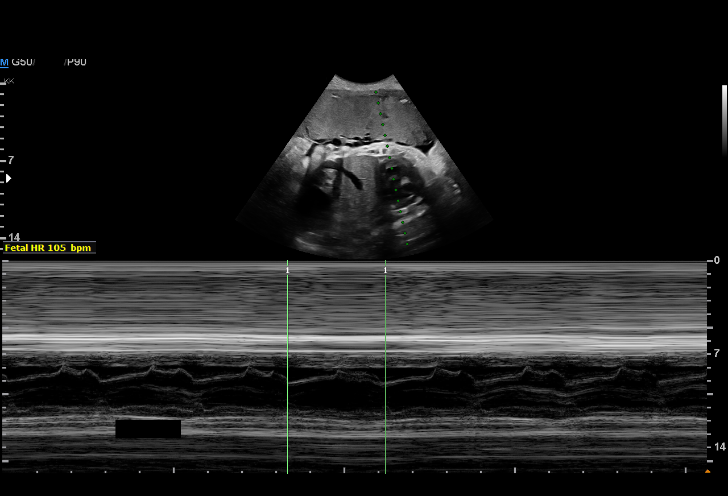
[im 9/14]
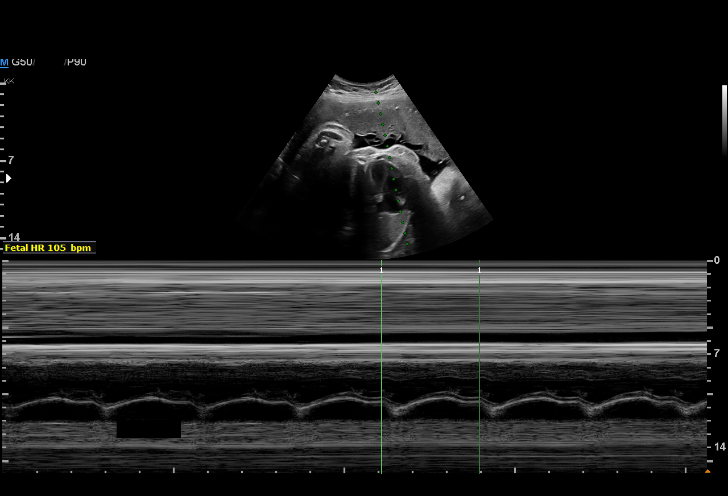
[im 10/14]
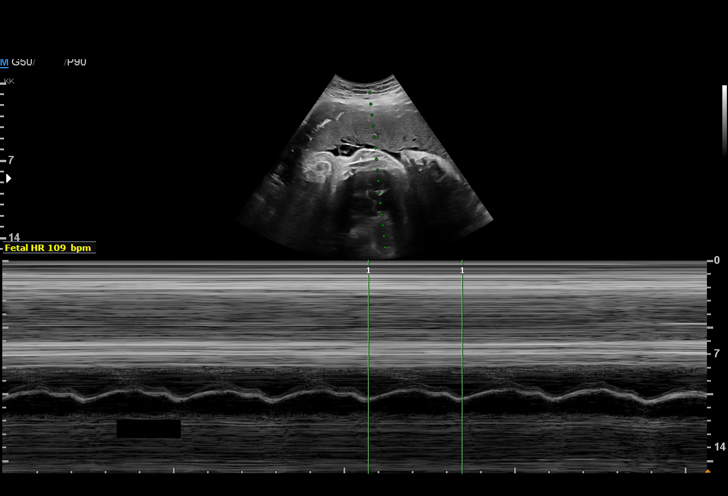
[im 11/14]
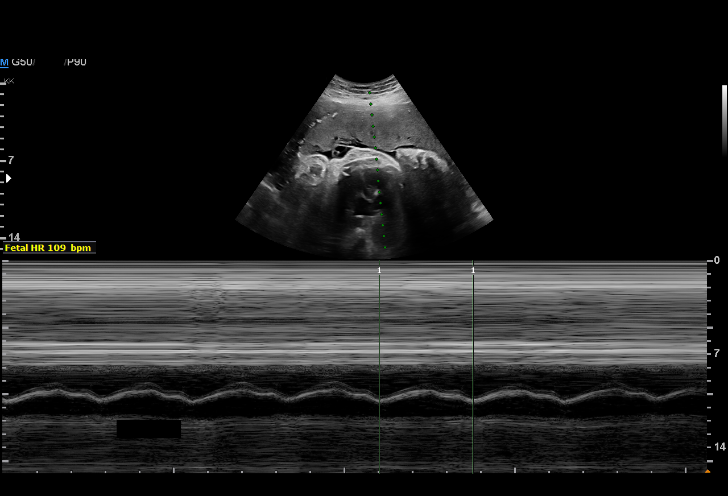
[im 12/14]
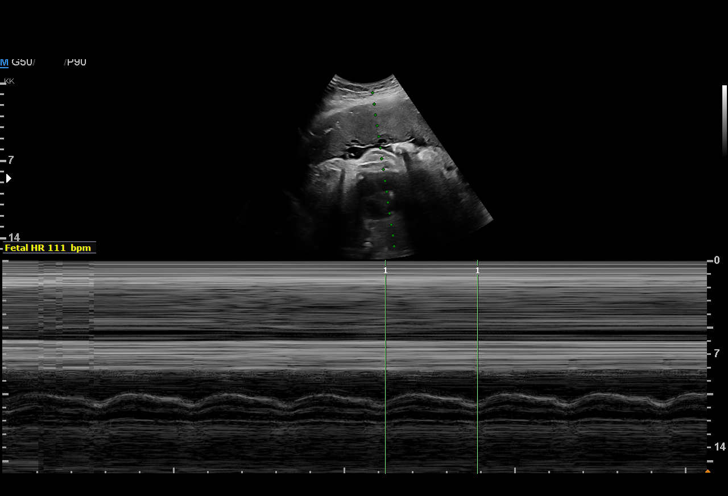
[im 13/14]
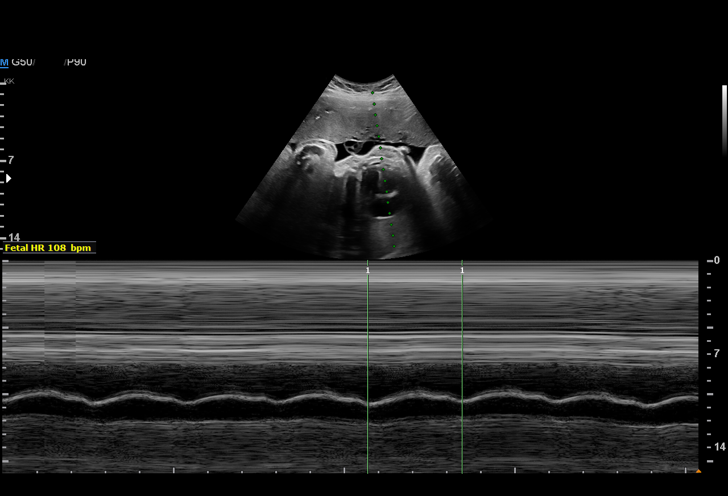
[im 14/14]
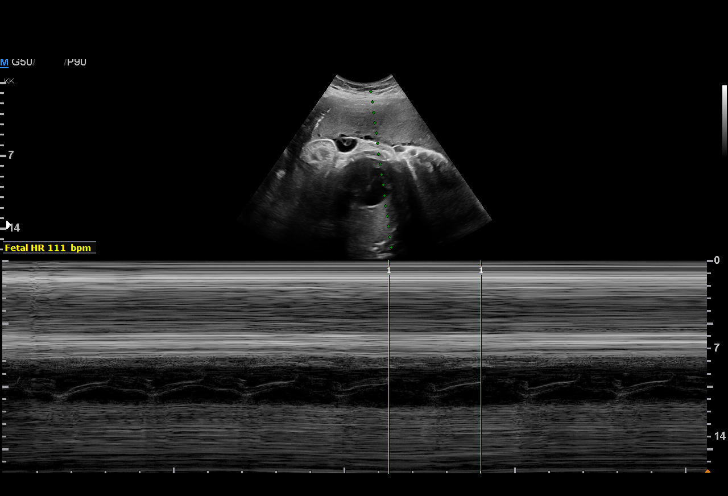

[14 of 14 positions shown; findings below may reference images not displayed]

Krunal DO

1  MAMEDOVA AMBARTSUMYAN          [id number]      [id number]     [id number]
Indications

40 weeks gestation of pregnancy
Advanced maternal age multigravida 35+,
second trimester (declined testing)
Previous cesarean delivery, antepartum
Postdate pregnancy (40-42 weeks)
Non-reactive NST
Decreased fetal movement
OB History

Blood Type:            Height:  5'4"   Weight (lb):  206      BMI:
Gravidity:    5         Term:   3        Prem:   0        SAB:   0
TOP:          1       Ectopic:  0        Living: 3
Fetal Evaluation

Num Of Fetuses:     1
Fetal Heart         111
Rate(bpm):
Cardiac Activity:   Observed
Presentation:       Cephalic

Amniotic Fluid
AFI FV:      Subjectively within normal limits
Biophysical Evaluation

Amniotic F.V:   Within normal limits       F. Tone:        Observed
F. Movement:    Not Observed               Score:          [DATE]
F. Breathing:   Observed
Gestational Age

LMP:           40w 0d       Date:   08/07/16                 EDD:   05/14/17
Best:          40w 0d    Det. By:   LMP  (08/07/16)          EDD:   05/14/17
Impression

SIUP at 40+0 weeks
Cephalic presentation
Amniotic fluid volume subjectively normal by sonographer; no
images obtained
BPP [DATE] (-2 for < 3 gross body movements); BPP cut short
due to low heart rate
Recommendations

Follow-up as clinically indicated
